# Patient Record
Sex: Female | Born: 1970 | Race: Black or African American | Hispanic: No | Smoking: Current some day smoker
Health system: Southern US, Community
[De-identification: ages and names within clinical notes are randomized; demographics above are authoritative.]

## PROBLEM LIST (undated history)

## (undated) HISTORY — PX: TUBAL LIGATION: SHX77

---

## 2019-05-17 ENCOUNTER — Encounter: Payer: Self-pay | Admitting: *Deleted

## 2019-08-01 ENCOUNTER — Other Ambulatory Visit: Payer: Self-pay

## 2019-08-01 ENCOUNTER — Ambulatory Visit (INDEPENDENT_AMBULATORY_CARE_PROVIDER_SITE_OTHER): Payer: BC Managed Care – PPO | Admitting: *Deleted

## 2019-08-01 DIAGNOSIS — Z1211 Encounter for screening for malignant neoplasm of colon: Secondary | ICD-10-CM

## 2019-08-01 MED ORDER — PEG 3350-KCL-NA BICARB-NACL 420 G PO SOLR: 4000.0000 mL | Freq: Once | ORAL | 0 refills | Status: AC

## 2019-08-01 NOTE — Progress Notes (Signed)
Gastroenterology Pre-Procedure Review  Request Date: 08/01/2019 Requesting Physician: Thornton Papas, NP @ [facility] [place], No previous TCS  PATIENT REVIEW QUESTIONS: The patient responded to the following health history questions as indicated:    1. Diabetes Melitis: No 2. Joint replacements in the past 12 months: No 3. Major health problems in the past 3 months: No 4. Has an artificial valve or MVP: No 5. Has a defibrillator: No 6. Has been advised in past to take antibiotics in advance of a procedure like teeth cleaning: No 7. Family history of colon cancer: No  8. Alcohol Use: Yes, 1 glass of wine daily 9. History of sleep apnea:  No 10. History of coronary artery or other vascular stents placed within the last 12 months: No 11. History of any prior anesthesia complications: No    MEDICATIONS & ALLERGIES:    Patient reports the following regarding taking any blood thinners:   Plavix? No Aspirin? No Coumadin? No Brilinta? No Xarelto? No Eliquis? No Pradaxa? No Savaysa? No Effient? No  Patient confirms/reports the following medications:  Current Outpatient Medications  Medication Sig Dispense Refill  . Multiple Vitamins-Minerals (ALIVE ONCE DAILY WOMENS PO) Take by mouth daily.    . phentermine 37.5 MG capsule Take 37.5 mg by mouth every other day.    Marland Kitchen UNABLE TO FIND daily. Arena Immune Plus adult gummie     No current facility-administered medications for this visit.     Patient confirms/reports the following allergies:  No Known Allergies  No orders of the defined types were placed in this encounter.   AUTHORIZATION INFORMATION Primary Insurance: [place],  [place] #: [id number],  Group #: [id number] Pre-Cert / Josem Kaufmann required: No, file to local BCBS  SCHEDULE INFORMATION: Procedure has been scheduled as follows:  Date: 10/04/2019, Time: 11:00 Location: [facility] with Dr. Oneida Alar  This Gastroenterology Pre-Precedure Review Form is being routed to the following  provider(s): Neil Crouch, PA-C

## 2019-08-01 NOTE — Patient Instructions (Signed)
Anna Sosa   Jun 26, 1971 MRN: [id number]    Procedure Date: 10/04/2019 Time to register: 10:00 am Place to register: [facility] Procedure Time: 11:00 am Scheduled provider: Dr. Oneida Alar  PREPARATION FOR COLONOSCOPY WITH TRI-LYTE SPLIT PREP  Please notify us immediately if you are diabetic, take iron supplements, or if you are on Coumadin or any other blood thinners.   You will need to purchase 1 fleet enema and 1 box of Bisacodyl '5mg'$  tablets.   1 DAY BEFORE PROCEDURE:  DATE: 10/03/2019  DAY: Tuesday Continue clear liquids the entire day - NO SOLID FOOD.    At 2:00 pm:  Take 2 Bisacodyl tablets.   At 4:00pm:  Start drinking your solution. Make sure you mix well per instructions on the bottle. Try to drink 1 (one) 8 ounce glass every 10-15 minutes until you have consumed HALF the jug. You should complete by 6:00pm.You must keep the left over solution refrigerated until completed next day.  Continue clear liquids. You must drink plenty of clear liquids to prevent dehyration and kidney failure.     DAY OF PROCEDURE:   DATE: 10/04/2019  DAY: Wednesday If you take medications for your heart, blood pressure or breathing, you may take these medications.    Five hours before your procedure time @ 6:00 am:  Finish remaining amout of bowel prep, drinking 1 (one) 8 ounce glass every 10-15 minutes until complete. You have two hours to consume remaining prep.   Three hours before your procedure time @ 8:00 am:  Nothing by mouth.   At least one hour before going to the hospital:  Give yourself one Fleet enema. You may take your morning medications with sip of water unless we have instructed otherwise.      Please see below for Dietary Information.  CLEAR LIQUIDS INCLUDE:  Water Jello (NOT red in color)   Ice Popsicles (NOT red in color)   Tea (sugar ok, no milk/cream) Powdered fruit flavored drinks  Coffee (sugar ok, no milk/cream) Gatorade/ Lemonade/ Kool-Aid  (NOT red in  color)   Juice: apple, white grape, white cranberry Soft drinks  Clear bullion, consomme, broth (fat free beef/chicken/vegetable)  Carbonated beverages (any kind)  Strained chicken noodle soup Hard Candy   Remember: Clear liquids are liquids that will allow you to see your fingers on the other side of a clear glass. Be sure liquids are NOT red in color, and not cloudy, but CLEAR.  DO NOT EAT OR DRINK ANY OF THE FOLLOWING:  Dairy products of any kind   Cranberry juice Tomato juice / V8 juice   Grapefruit juice Orange juice     Red grape juice  Do not eat any solid foods, including such foods as: cereal, oatmeal, yogurt, fruits, vegetables, creamed soups, eggs, bread, crackers, pureed foods in a blender, etc.   HELPFUL HINTS FOR DRINKING PREP SOLUTION:   Make sure prep is extremely cold. Mix and refrigerate the the morning of the prep. You may also put in the freezer.   You may try mixing some Crystal Light or Country Time Lemonade if you prefer. Mix in small amounts; add more if necessary.  Try drinking through a straw  Rinse mouth with water or a mouthwash between glasses, to remove after-taste.  Try sipping on a cold beverage /ice/ popsicles between glasses of prep.  Place a piece of sugar-free hard candy in mouth between glasses.  If you become nauseated, try consuming smaller amounts, or stretch out the time  between glasses. Stop for 30-60 minutes, then slowly start back drinking.        OTHER INSTRUCTIONS  You will need a responsible adult at least 48 years of age to accompany you and drive you home. This person must remain in the waiting room during your procedure. The hospital will cancel your procedure if you do not have a responsible adult with you.   1. Wear loose fitting clothing that is easily removed. 2. Leave jewelry and other valuables at home.  3. Remove all body piercing jewelry and leave at home. 4. Total time from sign-in until discharge is approximately  2-3 hours. 5. You should go home directly after your procedure and rest. You can resume normal activities the day after your procedure. 6. The day of your procedure you should not:  Drive  Make legal decisions  Operate machinery  Drink alcohol  Return to work   You may call the office (Dept: [phone]) before 5:00pm, or page the doctor on call [phone]) after 5:00pm, for further instructions, if necessary.   Insurance Information YOU WILL NEED TO CHECK WITH YOUR INSURANCE COMPANY FOR THE BENEFITS OF COVERAGE YOU HAVE FOR THIS PROCEDURE.  UNFORTUNATELY, NOT ALL INSURANCE COMPANIES HAVE BENEFITS TO COVER ALL OR PART OF THESE TYPES OF PROCEDURES.  IT IS YOUR RESPONSIBILITY TO CHECK YOUR BENEFITS, HOWEVER, WE WILL BE GLAD TO ASSIST YOU WITH ANY CODES YOUR INSURANCE COMPANY MAY NEED.    PLEASE NOTE THAT MOST INSURANCE COMPANIES WILL NOT COVER A SCREENING COLONOSCOPY FOR PEOPLE UNDER THE AGE OF 50  IF YOU HAVE BCBS INSURANCE, YOU MAY HAVE BENEFITS FOR A SCREENING COLONOSCOPY BUT IF POLYPS ARE FOUND THE DIAGNOSIS WILL CHANGE AND THEN YOU MAY HAVE A DEDUCTIBLE THAT WILL NEED TO BE MET. SO PLEASE MAKE SURE YOU CHECK YOUR BENEFITS FOR A SCREENING COLONOSCOPY AS WELL AS A DIAGNOSTIC COLONOSCOPY.

## 2019-08-04 NOTE — Progress Notes (Signed)
Ok to schedule.

## 2019-08-08 NOTE — Addendum Note (Signed)
Addended by: Metro Kung on: 08/08/2019 09:50 AM   Modules accepted: Orders, SmartSet

## 2019-10-03 ENCOUNTER — Other Ambulatory Visit (HOSPITAL_COMMUNITY)
Admission: RE | Admit: 2019-10-03 | Discharge: 2019-10-03 | Disposition: A | Discharge status: Home / Self Care | Payer: BC Managed Care – PPO | Source: Ambulatory Visit | Attending: Gastroenterology | Admitting: Gastroenterology

## 2019-10-03 ENCOUNTER — Other Ambulatory Visit: Payer: Self-pay

## 2019-10-03 DIAGNOSIS — Z01812 Encounter for preprocedural laboratory examination: Secondary | ICD-10-CM | POA: Insufficient documentation

## 2019-10-03 DIAGNOSIS — Z20828 Contact with and (suspected) exposure to other viral communicable diseases: Secondary | ICD-10-CM | POA: Insufficient documentation

## 2019-10-03 LAB — SARS CORONAVIRUS 2 (TAT 6-24 HRS): SARS Coronavirus 2: NEGATIVE

## 2019-10-04 ENCOUNTER — Ambulatory Visit (HOSPITAL_COMMUNITY)
Admission: RE | Admit: 2019-10-04 | Discharge: 2019-10-04 | Disposition: A | Discharge status: Home / Self Care | Payer: BC Managed Care – PPO | Attending: Gastroenterology | Admitting: Gastroenterology

## 2019-10-04 ENCOUNTER — Encounter (HOSPITAL_COMMUNITY)
Admission: RE | Disposition: A | Discharge status: Home / Self Care | Payer: Self-pay | Source: Home / Self Care | Attending: Gastroenterology

## 2019-10-04 ENCOUNTER — Other Ambulatory Visit: Payer: Self-pay

## 2019-10-04 ENCOUNTER — Encounter (HOSPITAL_COMMUNITY): Payer: Self-pay | Admitting: *Deleted

## 2019-10-04 DIAGNOSIS — K635 Polyp of colon: Secondary | ICD-10-CM

## 2019-10-04 DIAGNOSIS — Q438 Other specified congenital malformations of intestine: Secondary | ICD-10-CM | POA: Insufficient documentation

## 2019-10-04 DIAGNOSIS — D123 Benign neoplasm of transverse colon: Secondary | ICD-10-CM | POA: Insufficient documentation

## 2019-10-04 DIAGNOSIS — K648 Other hemorrhoids: Secondary | ICD-10-CM | POA: Diagnosis not present

## 2019-10-04 DIAGNOSIS — K644 Residual hemorrhoidal skin tags: Secondary | ICD-10-CM | POA: Diagnosis not present

## 2019-10-04 DIAGNOSIS — K5730 Diverticulosis of large intestine without perforation or abscess without bleeding: Secondary | ICD-10-CM | POA: Insufficient documentation

## 2019-10-04 DIAGNOSIS — Z1211 Encounter for screening for malignant neoplasm of colon: Secondary | ICD-10-CM | POA: Diagnosis not present

## 2019-10-04 HISTORY — PX: POLYPECTOMY: SHX5525

## 2019-10-04 HISTORY — PX: COLONOSCOPY: SHX5424

## 2019-10-04 SURGERY — COLONOSCOPY
Anesthesia: Moderate Sedation

## 2019-10-04 MED ORDER — MIDAZOLAM HCL 5 MG/5ML IJ SOLN
INTRAMUSCULAR | Status: DC | PRN
  Administered 2019-10-04: 2 mg via INTRAVENOUS
  Administered 2019-10-04 (×2): 1 mg via INTRAVENOUS

## 2019-10-04 MED ORDER — STERILE WATER FOR IRRIGATION IR SOLN
Status: DC | PRN
  Administered 2019-10-04: 2.5 mL

## 2019-10-04 MED ORDER — MEPERIDINE HCL 100 MG/ML IJ SOLN
INTRAMUSCULAR | Status: DC | PRN
  Administered 2019-10-04 (×2): 25 mg

## 2019-10-04 MED ORDER — MIDAZOLAM HCL 5 MG/5ML IJ SOLN
INTRAMUSCULAR | Status: AC
  Filled 2019-10-04: qty 10

## 2019-10-04 MED ORDER — SODIUM CHLORIDE 0.9 % IV SOLN
INTRAVENOUS | Status: DC
  Administered 2019-10-04: 11:00:00 1000 mL via INTRAVENOUS

## 2019-10-04 MED ORDER — MEPERIDINE HCL 100 MG/ML IJ SOLN
INTRAMUSCULAR | Status: AC
  Filled 2019-10-04: qty 2

## 2019-10-04 NOTE — Op Note (Signed)
[facility] Patient Name: Anna Sosa Procedure Date: 10/04/2019 11:15 AM MRN: [id number] Date of Birth: Jul 03, 1971 Attending MD: Barney Drain MD, MD CSN: [id number] Age: 48 Admit Type: Outpatient Procedure:                Colonoscopy WITH COLD SNARE POLYPECTOMY Indications:              Screening for colorectal malignant neoplasm Providers:                Barney Drain MD, MD, Gwynneth Albright RN, RN,                            Nelma Rothman, Technician Referring MD:             Frances Maywood, FNP Medicines:                Meperidine 50 mg IV, Midazolam 4 mg IV Complications:            No immediate complications. Estimated Blood Loss:     Estimated blood loss was minimal. Procedure:                Pre-Anesthesia Assessment:                           - Prior to the procedure, a History and Physical                            was performed, and patient medications and                            allergies were reviewed. The patient's tolerance of                            previous anesthesia was also reviewed. The risks                            and benefits of the procedure and the sedation                            options and risks were discussed with the patient.                            All questions were answered, and informed consent                            was obtained. Prior Anticoagulants: The patient has                            taken no previous anticoagulant or antiplatelet                            agents except for NSAID medication. ASA Grade                            Assessment: II - A patient with mild systemic  disease. After reviewing the risks and benefits,                            the patient was deemed in satisfactory condition to                            undergo the procedure. After obtaining informed                            consent, the colonoscope was passed under direct                            vision.  Throughout the procedure, the patient's                            blood pressure, pulse, and oxygen saturations were                            monitored continuously. The PCF-[id number]                            scope was introduced through the anus and advanced                            to the the cecum, identified by appendiceal orifice                            and ileocecal valve. The colonoscopy was somewhat                            difficult due to a tortuous colon and the patient's                            agitation. Successful completion of the procedure                            was aided by increasing the dose of sedation                            medication, straightening and shortening the scope                            to obtain bowel loop reduction and COLOWRAP. The                            patient tolerated the procedure fairly well. The                            quality of the bowel preparation was good. The                            ileocecal valve, appendiceal orifice, and rectum  were photographed. Scope In: 11:51:32 AM Scope Out: 12:11:42 PM Scope Withdrawal Time: 0 hours 14 minutes 26 seconds  Total Procedure Duration: 0 hours 20 minutes 10 seconds  Findings:      A 4 mm polyp was found in the hepatic flexure. The polyp was sessile.       The polyp was removed with a cold snare. Resection and retrieval were       complete.      Multiple small and large-mouthed diverticula were found in the       recto-sigmoid colon and sigmoid colon.      External and internal hemorrhoids were found.      The recto-sigmoid colon, sigmoid colon, descending colon and splenic       flexure were moderately tortuous. Impression:               - One 4 mm polyp at the hepatic flexure, removed                            with a cold snare. Resected and retrieved.                           - Diverticulosis in the recto-sigmoid colon and in                             the sigmoid colon.                           - External and internal hemorrhoids.                           - Tortuous colon. Moderate Sedation:      Moderate (conscious) sedation was administered by the endoscopy nurse       and supervised by the endoscopist. The following parameters were       monitored: oxygen saturation, heart rate, blood pressure, and response       to care. Total physician intraservice time was 27 minutes. Recommendation:           - Patient has a contact number available for                            emergencies. The signs and symptoms of potential                            delayed complications were discussed with the                            patient. Return to normal activities tomorrow.                            Written discharge instructions were provided to the                            patient.                           - High fiber diet. LOSE WEIGHT TO A BMI < 30  FORLLOWING "THE 10 DAY DETOX DIET".                           - Continue present medications.                           - Await pathology results.                           - Repeat colonoscopy in 5-10 years for surveillance. Procedure Code(s):        --- Professional ---                           [id number], Colonoscopy, flexible; with removal of                            tumor(s), polyp(s), or other lesion(s) by snare                            technique                           99153, Moderate sedation; each additional 15                            minutes intraservice time                           G0500, Moderate sedation services provided by the                            same physician or other qualified health care                            professional performing a gastrointestinal                            endoscopic service that sedation supports,                            requiring the presence of an independent trained                             observer to assist in the monitoring of the                            patient's level of consciousness and physiological                            status; initial 15 minutes of intra-service time;                            patient age 24 years or older (additional time may                            be reported  with [phone], as appropriate) Diagnosis Code(s):        --- Professional ---                           Z12.11, Encounter for screening for malignant                            neoplasm of colon                           K63.5, Polyp of colon                           K64.8, Other hemorrhoids                           K57.30, Diverticulosis of large intestine without                            perforation or abscess without bleeding                           Q43.8, Other specified congenital malformations of                            intestine CPT copyright 2019 American Medical Association. All rights reserved. The codes documented in this report are preliminary and upon coder review may  be revised to meet current compliance requirements. Barney Drain, MD Barney Drain MD, MD 10/04/2019 12:29:19 PM This report has been signed electronically. Number of Addenda: 0

## 2019-10-04 NOTE — Discharge Instructions (Signed)
You have small internal hemorrhoids and diverticulosis IN YOUR LEFT COLON. YOU HAD ONE SMALL POLYP REMOVED.    EAT TO LIVE AND THINK OF FOOD AS MEDICINE. 75% OF YOUR PLATE SHOULD BE FRUITS/VEGGIES.  To have more energy, and to lose weight:      1. CONTINUE YOUR WEIGHT LOSS EFFORTS. I RECOMMEND YOU READ AND FOLLOW RECOMMENDATIONS BY DR. MARK HYMAN, "10-DAY DETOX DIET".    2. If you must eat bread, EAT EZEKIEL BREAD. IT IS IN THE FROZEN SECTION OF THE GROCERY STORE.    3. DRINK WATER WITH FRUIT OR CUCUMBER ADDED. YOUR URINE SHOULD BE LIGHT YELLOW. AVOID SODA, GATORADE, ENERGY DRINKS, OR DIET SODA.     4. AVOID HIGH FRUCTOSE CORN SYRUP AND CAFFEINE.     5. DO NOT chew SUGAR FREE GUM OR USE ARTIFICIAL SWEETENERS. IF NEEDED USE STEVIA AS A SWEETENER.    6. DO NOT EAT ENRICHED WHEAT FLOUR, PASTA, RICE, OR CEREAL.    7. ONLY EAT WILD CAUGHT SEAFOOD, GRASS FED BEEF OR CHICKEN, PORK FROM PASTURE RAISE PIGS, OR EGGS FROM PASTURE RAISED CHICKENS.    8. PRACTICE CHAIR YOGA FOR 15-30 MINS 3 OR 4 TIMES A WEEK AND PROGRESS TO HATHA YOGA OVER NEXT 6 MOS.    9. START TAKING A MULTIVITAMIN, VITAMIN B12, AND VITAMIN D3 2000 IU DAILY.   ADDITIONAL SUPPLEMENTS TO assist with weight loss, DECREASE CRAVING AND SUPPRESS YOUR APPETITE:    1. CINNAMON 500 MG EVERY AM PRIOR TO FIRST MEAL.   **STABILIZES BLOOD GLUCOSE/REDUCES CRAVINGS**    2. CHROMIUM 400-500 MG WITH MEALS TWICE DAILY.    **FAT BURNER**    3. GREEN TEA EXTRACT ONE DAILY.   **FAT BURNER/SUPPRESSES YOUR APPETITE**    4. ALPHA LIPOIC ACID TWICE DAILY.   **NATURAL ANTI-INFLAMMATORY SUPPLEMENT THAT IS AN ALTERNATIVE TO IBUPROFEN OR NAPROXEN** .  USE PREPARATION H FOUR TIMES  A DAY IF NEEDED TO RELIEVE RECTAL PAIN/PRESSURE/BLEEDING.  YOUR BIOPSY RESULTS WILL BE BACK IN 5 BUSINESS DAYS.  Next colonoscopy in 5-10 years.  Colonoscopy Care After Read the instructions outlined below and refer to this sheet in the next week. These discharge  instructions provide you with general information on caring for yourself after you leave the hospital. While your treatment has been planned according to the most current medical practices available, unavoidable complications occasionally occur. If you have any problems or questions after discharge, call DR. Trinisha Paget, [phone].  ACTIVITY  You may resume your regular activity, but move at a slower pace for the next 24 hours.   Take frequent rest periods for the next 24 hours.   Walking will help get rid of the air and reduce the bloated feeling in your belly (abdomen).   No driving for 24 hours (because of the medicine (anesthesia) used during the test).   You may shower.   Do not sign any important legal documents or operate any machinery for 24 hours (because of the anesthesia used during the test).    NUTRITION  Drink plenty of fluids.   You may resume your normal diet as instructed by your doctor.   Begin with a light meal and progress to your normal diet. Heavy or fried foods are harder to digest and may make you feel sick to your stomach (nauseated).   Avoid alcoholic beverages for 24 hours or as instructed.    MEDICATIONS  You may resume your normal medications.   WHAT YOU CAN EXPECT TODAY  Some feelings of bloating  in the abdomen.   Passage of more gas than usual.   Spotting of blood in your stool or on the toilet paper  .  IF YOU HAD POLYPS REMOVED DURING THE COLONOSCOPY:  Eat a soft diet IF YOU HAVE NAUSEA, BLOATING, ABDOMINAL PAIN, OR VOMITING.    FINDING OUT THE RESULTS OF YOUR TEST Not all test results are available during your visit. DR. Oneida Alar WILL CALL YOU WITHIN 14 DAYS OF YOUR PROCEDUE WITH YOUR RESULTS. Do not assume everything is normal if you have not heard from DR. Katsumi Wisler, CALL HER OFFICE AT [phone].  SEEK IMMEDIATE MEDICAL ATTENTION AND CALL THE OFFICE: [phone] IF:  You have more than a spotting of blood in your stool.   Your  belly is swollen (abdominal distention).   You are nauseated or vomiting.   You have a temperature over 101F.   You have abdominal pain or discomfort that is severe or gets worse throughout the day.  High-Fiber Diet A high-fiber diet changes your normal diet to include more whole grains, legumes, fruits, and vegetables. Changes in the diet involve replacing refined carbohydrates with unrefined foods. The calorie level of the diet is essentially unchanged. The Dietary Reference Intake (recommended amount) for adult males is 38 grams per day. For adult females, it is 25 grams per day. Pregnant and lactating women should consume 28 grams of fiber per day. Fiber is the intact part of a plant that is not broken down during digestion. Functional fiber is fiber that has been isolated from the plant to provide a beneficial effect in the body.  PURPOSE  Increase stool bulk.   Ease and regulate bowel movements.   Lower cholesterol.   REDUCE RISK OF COLON CANCER  INDICATIONS THAT YOU NEED MORE FIBER  Constipation and hemorrhoids.   Uncomplicated diverticulosis (intestine condition) and irritable bowel syndrome.   Weight management.   As a protective measure against hardening of the arteries (atherosclerosis), diabetes, and cancer.   GUIDELINES FOR INCREASING FIBER IN THE DIET  Start adding fiber to the diet slowly. A gradual increase of about 5 more grams (2 servings of most fruits or vegetables) per day is best. Too rapid an increase in fiber may result in constipation, flatulence, and bloating.   Drink enough water and fluids to keep your urine clear or pale yellow. Water, juice, or caffeine-free drinks are recommended. Not drinking enough fluid may cause constipation.   Eat a variety of high-fiber foods rather than one type of fiber.   Try to increase your intake of fiber through using high-fiber foods rather than fiber pills or supplements that contain small amounts of fiber.   The  goal is to change the types of food eaten. Do not supplement your present diet with high-fiber foods, but replace foods in your present diet.    Polyps, Colon  A polyp is extra tissue that grows inside your body. Colon polyps grow in the large intestine. The large intestine, also called the colon, is part of your digestive system. It is a long, hollow tube at the end of your digestive tract where your body makes and stores stool. Most polyps are not dangerous. They are benign. This means they are not cancerous. But over time, some types of polyps can turn into cancer. Polyps that are smaller than a pea are usually not harmful. But larger polyps could someday become or may already be cancerous. To be safe, doctors remove all polyps and test them.   PREVENTION There  is not one sure way to prevent polyps. You might be able to lower your risk of getting them if you:  Eat more fruits and vegetables and less fatty food.   Do not smoke.   Avoid alcohol.   Exercise every day.   Lose weight if you are overweight.   Eating more calcium and folate can also lower your risk of getting polyps. Some foods that are rich in calcium are milk, cheese, and broccoli. Some foods that are rich in folate are chickpeas, kidney beans, and spinach.    Diverticulosis Diverticulosis is a common condition that develops when small pouches (diverticula) form in the wall of the colon. The risk of diverticulosis increases with age. It happens more often in people who eat a low-fiber diet. Most individuals with diverticulosis have no symptoms. Those individuals with symptoms usually experience belly (abdominal) pain, constipation, or loose stools (diarrhea).  HOME CARE INSTRUCTIONS  Increase the amount of fiber in your diet as directed by your caregiver or dietician. This may reduce symptoms of diverticulosis.   Drink at least 6 to 8 glasses of water each day to prevent constipation.   Try not to strain when you have a  bowel movement.   Avoiding nuts and seeds to prevent complications is NOT NECESSARY.   FOODS HAVING HIGH FIBER CONTENT INCLUDE:  Fruits. Apple, peach, pear, tangerine, raisins, prunes.   Vegetables. Brussels sprouts, asparagus, broccoli, cabbage, carrot, cauliflower, romaine lettuce, spinach, summer squash, tomato, winter squash, zucchini.   Starchy Vegetables. Baked beans, kidney beans, lima beans, split peas, lentils, potatoes (with skin).   SEEK IMMEDIATE MEDICAL CARE IF:  You develop increasing pain or severe bloating.   You have an oral temperature above 101F.   You develop vomiting or bowel movements that are bloody or black.

## 2019-10-04 NOTE — H&P (Signed)
Primary Care Physician:  Frances Maywood, FNP Primary Gastroenterologist:  Dr. Oneida Alar  Pre-Procedure History & Physical: HPI:  Anna Sosa is a 48 y.o. female here for [place]. Ate FULL MEAL YESTERDAY.  History reviewed. No pertinent past medical history.  Past Surgical History:  Procedure Laterality Date  . TUBAL LIGATION      Prior to Admission medications   Medication Sig Start Date End Date Taking? Authorizing Provider  acetaminophen (TYLENOL) 500 MG tablet Take 1,000 mg by mouth every 6 (six) hours as needed for moderate pain or headache.   Yes [provider]  Ca Carbonate-Mag Hydroxide (ROLAIDS PO) Take 2 tablets by mouth daily as needed (acid reflux).   Yes [provider]  diphenhydrAMINE (BENADRYL) 25 MG tablet Take 25 mg by mouth at bedtime as needed for allergies or sleep.   Yes [provider]  Ferrous Sulfate (IRON PO) Take 9 mg by mouth daily.   Yes [provider]  ibuprofen (ADVIL) 200 MG tablet Take 600 mg by mouth every 6 (six) hours as needed for moderate pain or cramping.   Yes [provider]  Misc Natural Products (IMMUNE FORMULA PO) Take 1 tablet by mouth daily. Immune Plus   Yes [provider]  phentermine 37.5 MG capsule Take 37.5 mg by mouth every other day.   Yes [provider]  vitamin B-12 (CYANOCOBALAMIN) 1000 MCG tablet Take 1,000 mcg by mouth daily.   Yes [provider]    Allergies as of 08/08/2019  . (No Known Allergies)    Family History  Problem Relation Age of Onset  . Lymphoma Mother   . Prostate cancer Father     Social History   Socioeconomic History  . Marital status: Divorced    Spouse name: Not on file  . Number of children: Not on file  . Years of education: Not on file  . Highest education level: Not on file  Occupational History  . Not on file  Social Needs  . Financial resource strain: Not on file  . Food insecurity    Worry: Not on  file    Inability: Not on file  . Transportation needs    Medical: Not on file    Non-medical: Not on file  Tobacco Use  . Smoking status: Current Some Day Smoker  . Smokeless tobacco: Former Systems developer  . Tobacco comment: Smokes 1/2 of a small cigar every other day  Substance and Sexual Activity  . Alcohol use: Yes    Comment: 1 glass of wine daily  . Drug use: Never  . Sexual activity: Not on file  Lifestyle  . Physical activity    Days per week: Not on file    Minutes per session: Not on file  . Stress: Not on file  Relationships  . Social Herbalist on phone: Not on file    Gets together: Not on file    Attends religious service: Not on file    Active member of club or organization: Not on file    Attends meetings of clubs or organizations: Not on file    Relationship status: Not on file  . Intimate partner violence    Fear of current or ex partner: Not on file    Emotionally abused: Not on file    Physically abused: Not on file    Forced sexual activity: Not on file  Other Topics Concern  . Not on file  Social History  Narrative  . Not on file    Review of Systems: See HPI, otherwise negative ROS   Physical Exam: BP (!) 147/94   Pulse 80   Temp 98.6 F (37 C) (Oral)   Resp 14   Ht 5' (1.524 m)   Wt 90.7 kg   LMP 09/29/2019   SpO2 91%   BMI 39.06 kg/m  General:   Alert,  pleasant and cooperative in NAD Head:  Normocephalic and atraumatic. Neck:  Supple; Lungs:  Clear throughout to auscultation.    Heart:  Regular rate and rhythm. Abdomen:  Soft, nontender and nondistended. Normal bowel sounds, without guarding, and without rebound.   Neurologic:  Alert and  oriented x4;  grossly normal neurologically.  Impression/Plan:    SCREENING  Plan:  1. TCS TODAY DISCUSSED PROCEDURE, BENEFITS, & RISKS: < 1% chance of medication reaction, bleeding, perforation, ASPIRATION, or rupture of spleen/liver requiring surgery to fix it and missed polyps < 1 cm  10-20% of the time.

## 2019-10-05 LAB — SURGICAL PATHOLOGY

## 2019-10-06 ENCOUNTER — Encounter (HOSPITAL_COMMUNITY): Payer: Self-pay | Admitting: Gastroenterology

## 2019-10-07 ENCOUNTER — Telehealth: Payer: Self-pay | Admitting: Gastroenterology

## 2019-10-07 NOTE — Telephone Encounter (Signed)
Please call pt. She had ONE simple adenoma removed.  DRINK WATER TO KEEP YOUR URINE LIGHT YELLOW. FOLLOW A HIGH FIBER DIET.  YOU SHOULD HAVE YOUR NEXT COLONOSCOPY AS SOON AS 2025 OR AS LATE AS 2027.

## 2019-10-09 NOTE — Telephone Encounter (Signed)
Reminder in epic °

## 2019-10-09 NOTE — Telephone Encounter (Signed)
Letter mailed with info.

## 2019-10-18 ENCOUNTER — Emergency Department (HOSPITAL_COMMUNITY): Payer: BC Managed Care – PPO

## 2019-10-18 ENCOUNTER — Other Ambulatory Visit: Payer: Self-pay

## 2019-10-18 ENCOUNTER — Encounter (HOSPITAL_COMMUNITY): Payer: Self-pay | Admitting: Emergency Medicine

## 2019-10-18 ENCOUNTER — Emergency Department (HOSPITAL_COMMUNITY)
Admission: EM | Admit: 2019-10-18 | Discharge: 2019-10-18 | Disposition: A | Discharge status: Home / Self Care | Payer: BC Managed Care – PPO | Attending: Emergency Medicine | Admitting: Emergency Medicine

## 2019-10-18 DIAGNOSIS — Y999 Unspecified external cause status: Secondary | ICD-10-CM | POA: Insufficient documentation

## 2019-10-18 DIAGNOSIS — Y92410 Unspecified street and highway as the place of occurrence of the external cause: Secondary | ICD-10-CM | POA: Insufficient documentation

## 2019-10-18 DIAGNOSIS — Y93I9 Activity, other involving external motion: Secondary | ICD-10-CM | POA: Diagnosis not present

## 2019-10-18 DIAGNOSIS — S0181XA Laceration without foreign body of other part of head, initial encounter: Secondary | ICD-10-CM

## 2019-10-18 DIAGNOSIS — F17210 Nicotine dependence, cigarettes, uncomplicated: Secondary | ICD-10-CM | POA: Insufficient documentation

## 2019-10-18 DIAGNOSIS — S0990XA Unspecified injury of head, initial encounter: Secondary | ICD-10-CM

## 2019-10-18 LAB — POC URINE PREG, ED: Preg Test, Ur: NEGATIVE

## 2019-10-18 MED ORDER — LIDOCAINE HCL (PF) 1 % IJ SOLN
INTRAMUSCULAR | Status: AC
  Administered 2019-10-18: 5 mL
  Filled 2019-10-18: qty 5

## 2019-10-18 MED ORDER — LIDOCAINE-EPINEPHRINE-TETRACAINE (LET) TOPICAL GEL: 3.0000 mL | Freq: Once | TOPICAL | Status: DC

## 2019-10-18 MED ORDER — LIDOCAINE-EPINEPHRINE-TETRACAINE (LET) SOLUTION
3.0000 mL | Freq: Once | NASAL | Status: AC
  Administered 2019-10-18: 3 mL via TOPICAL
  Filled 2019-10-18: qty 3

## 2019-10-18 MED ORDER — HYDROCODONE-ACETAMINOPHEN 5-325 MG PO TABS: 1.0000 | ORAL_TABLET | Freq: Four times a day (QID) | ORAL | 0 refills | Status: DC | PRN
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
## 2019-10-19 MED FILL — Hydrocodone-Acetaminophen Tab 5-325 MG: ORAL | Qty: 6 | Status: AC
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
## 2021-07-09 ENCOUNTER — Other Ambulatory Visit: Payer: Self-pay | Admitting: Podiatry

## 2021-07-09 DIAGNOSIS — M79671 Pain in right foot: Secondary | ICD-10-CM

## 2021-07-09 DIAGNOSIS — S99921A Unspecified injury of right foot, initial encounter: Secondary | ICD-10-CM

## 2021-07-21 ENCOUNTER — Other Ambulatory Visit: Payer: Self-pay

## 2021-07-21 ENCOUNTER — Ambulatory Visit
Admission: RE | Admit: 2021-07-21 | Discharge: 2021-07-21 | Disposition: A | Discharge status: Home / Self Care | Payer: BC Managed Care – PPO | Source: Ambulatory Visit | Attending: Podiatry | Admitting: Podiatry

## 2021-07-21 DIAGNOSIS — S99921A Unspecified injury of right foot, initial encounter: Secondary | ICD-10-CM

## 2021-07-21 DIAGNOSIS — M79671 Pain in right foot: Secondary | ICD-10-CM

## 2022-12-03 IMAGING — MR MR FOOT*R* W/O CM
4 of 5 series · 18 of 40 positions shown · non-contrast
Comparison: None.

CLINICAL DATA: First MTP joint pain for 5 months. Injury while
dancing

EXAM:
MRI OF THE RIGHT FOREFOOT WITHOUT CONTRAST
TECHNIQUE: Multiplanar, multisequence MR imaging of the right forefoot was
performed. No intravenous contrast was administered.

[Series 4: T1 · coronal · 3.0mm · 0.22mm/px · 3 of 45 slices shown (1 of 2)]
[im 5/45]
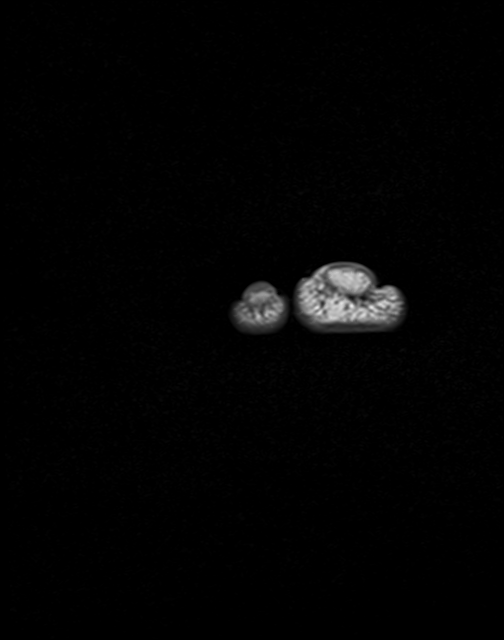
[im 23/45]
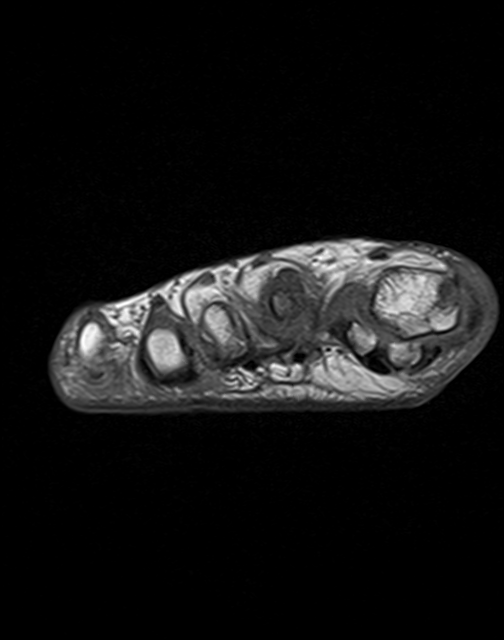
[im 40/45]
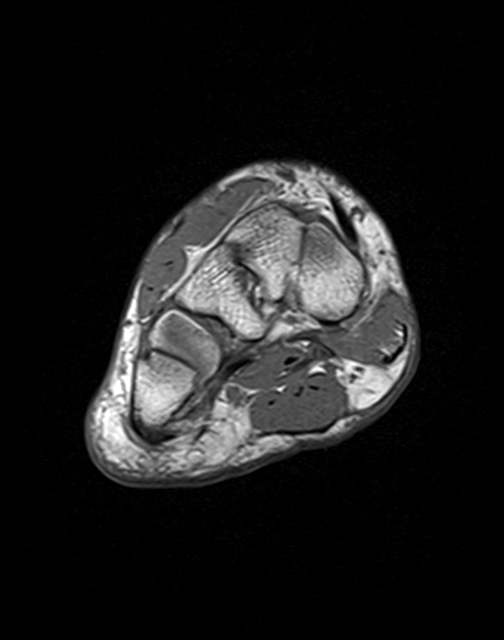

[Series 5: T2 fat-sat · coronal · 3.0mm · 0.22mm/px · 9 of 45 slices shown (1 of 2)]
[im 1/45]
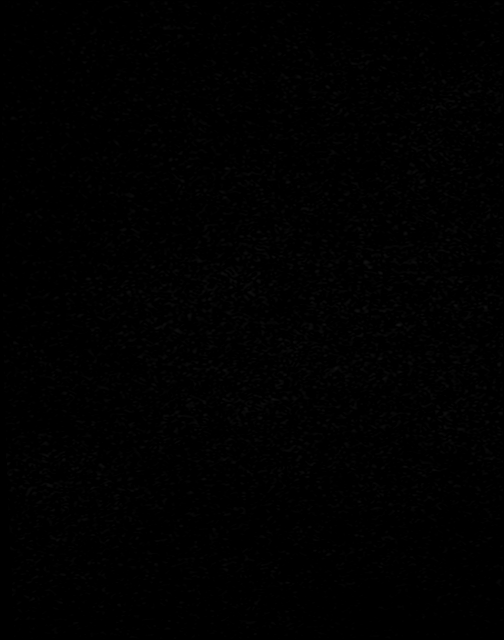
[im 5/45]
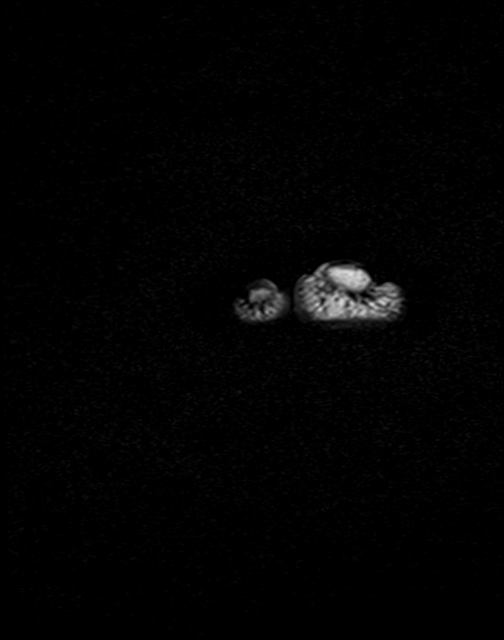
[im 9/45]
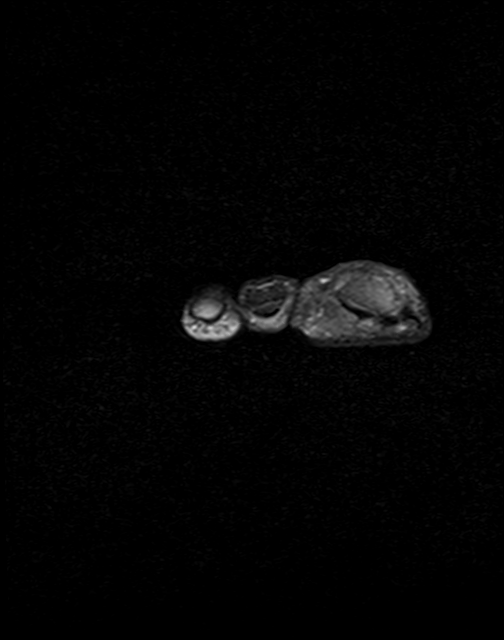
[im 14/45]
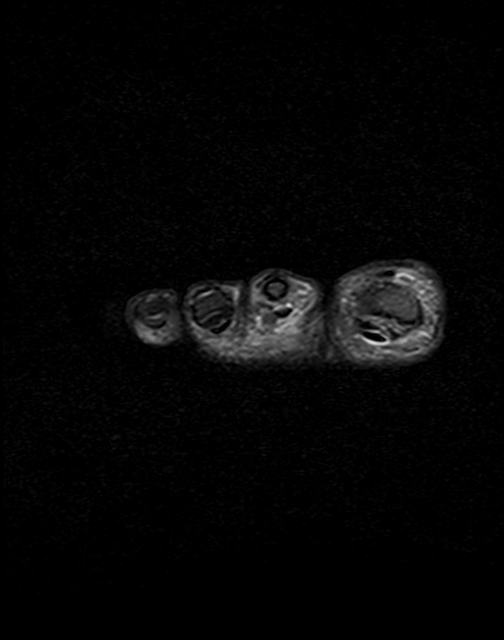
[im 18/45]
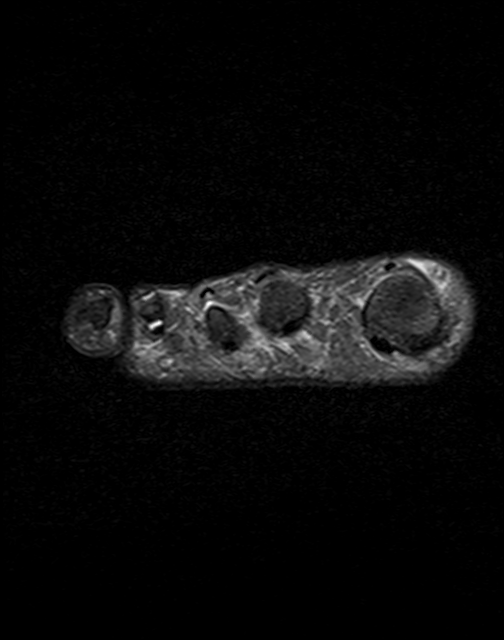
[im 23/45]
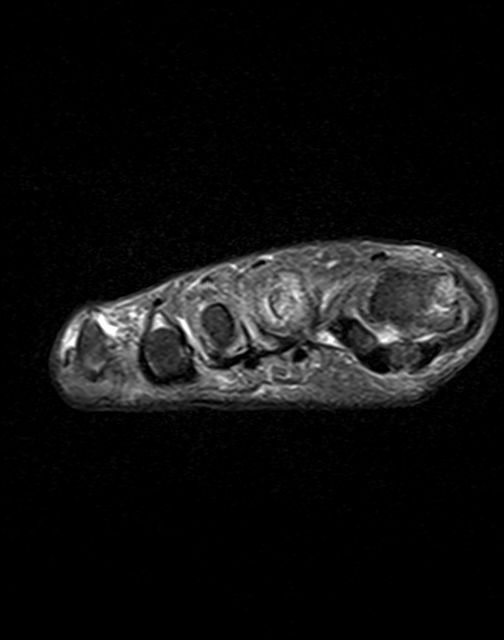
[im 27/45]
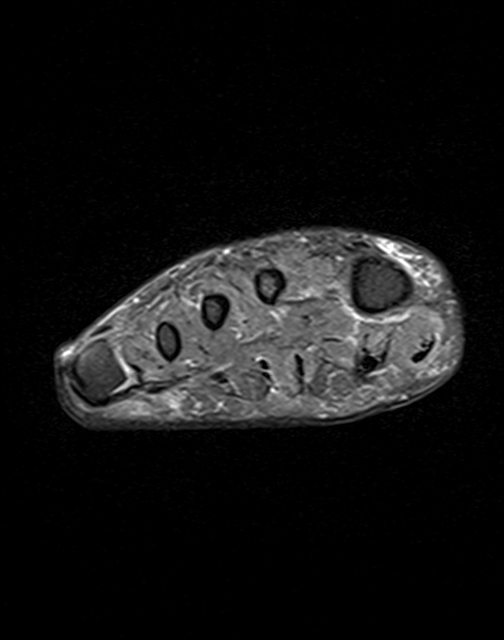
[im 31/45]
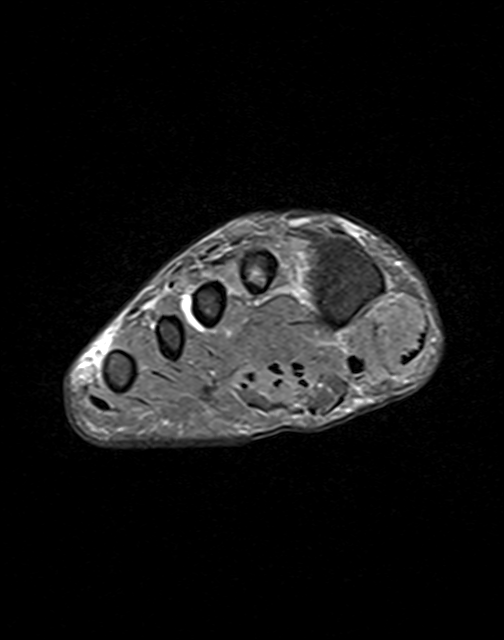
[im 40/45]
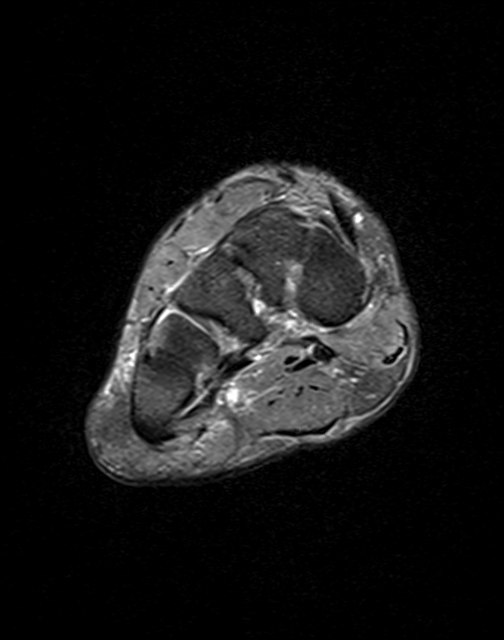

[Series 6: T1 · axial · 3.0mm · 0.35mm/px · z∈[-113,-35]mm · 3 of 22 slices shown (2 of 2)]
[im 1/22]
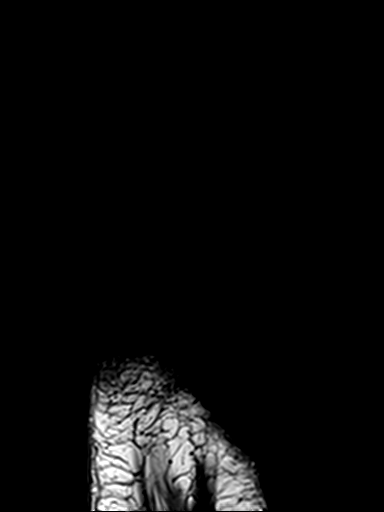
[im 11/22]
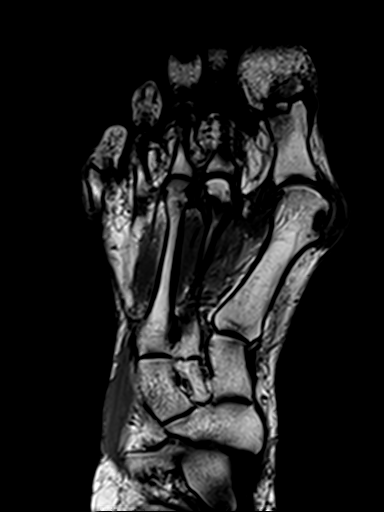
[im 22/22]
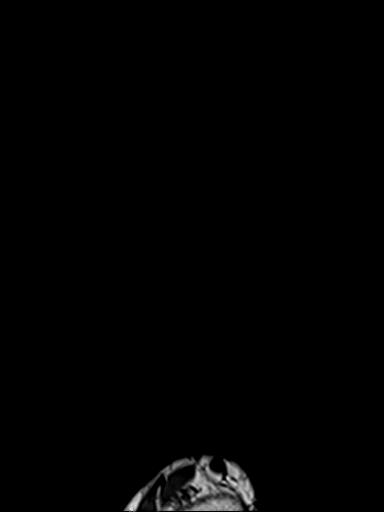

[Series 7: T2 fat-sat · axial · 3.0mm · 0.35mm/px · z∈[-113,-35]mm · 3 of 22 slices shown (2 of 2)]
[im 1/22]
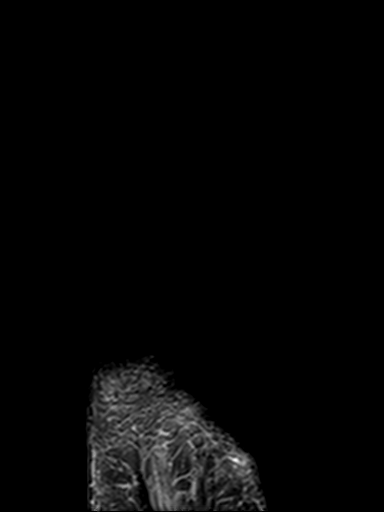
[im 11/22]
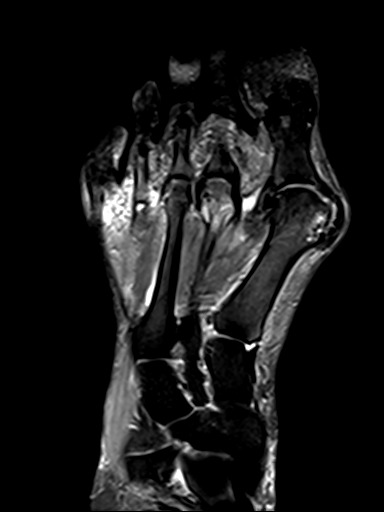
[im 22/22]
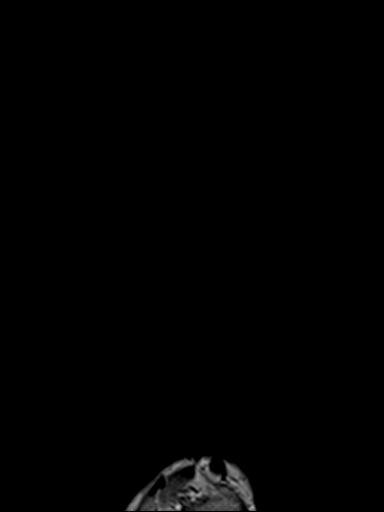

[18 of 40 positions shown; findings below may reference images not displayed]

FINDINGS: Bones/Joint/Cartilage

Acute nondisplaced fracture of the second metatarsal neck with
prominent adjacent bone marrow edema. Mild bone marrow edema
throughout the second metatarsal diaphysis. No additional fractures.
Moderate-severe hallux valgus deformity with moderate osteoarthritis
of the first MTP joint. Lateral subluxation of the hallux sesamoids.
Mild osteoarthritis of the hallux sesamoid complex. Possible erosion
along the medial margin of the first metatarsal head potentially
reflecting sequela of crystalline arthropathy such as gout.
Remaining osseous structures are within normal limits.

Ligaments

Intact Lisfranc ligament. First MTP joint capsular thickening. No
evidence of acute collateral ligament injury.

Muscles and Tendons

Intact flexor and extensor tendons without tear or tenosynovitis.
Preserved muscle bulk and signal intensity.

Soft tissues

Small amount of fluid within the first and third intermetatarsal
spaces. Elsewhere, no soft tissue edema or fluid collection. No
ulcerations.
IMPRESSION: 1. Acute nondisplaced fracture of the second metatarsal neck with
prominent adjacent bone marrow edema and mild bone marrow edema
throughout the second metatarsal diaphysis.
2. Moderate-severe hallux valgus deformity with moderate
osteoarthritis of the first MTP joint.
3. Possible erosion along the medial margin of the first metatarsal
head potentially reflecting sequela of crystalline arthropathy such
as gout.

## 2023-09-03 ENCOUNTER — Ambulatory Visit: Payer: BC Managed Care – PPO | Admitting: Podiatry

## 2023-09-09 ENCOUNTER — Ambulatory Visit: Payer: BC Managed Care – PPO | Admitting: Podiatry

## 2023-09-09 ENCOUNTER — Ambulatory Visit (INDEPENDENT_AMBULATORY_CARE_PROVIDER_SITE_OTHER): Payer: BC Managed Care – PPO

## 2023-09-09 DIAGNOSIS — M21611 Bunion of right foot: Secondary | ICD-10-CM

## 2023-09-09 DIAGNOSIS — M21619 Bunion of unspecified foot: Secondary | ICD-10-CM

## 2023-09-09 NOTE — Progress Notes (Signed)
  Subjective:  Patient ID: Anna Sosa, female    DOB: February 13, 1971,  MRN: [id number]  Chief Complaint  Patient presents with   Bunions    Bunion to right foot. Patient is interested in surgery.      52 y.o. female presents with concern for large bunion to the right foot.  Patient having a lot of pain and not able to wear regular shoes due to the severity of the bunion deformity.  No past medical history on file.  No Known Allergies  ROS: Negative except as per HPI above  Objective:  General: AAO x3, NAD  Dermatological: With inspection and palpation of the right and left lower extremities there are no open sores, no preulcerative lesions, no rash or signs of infection present. Nails are of normal length thickness and coloration.   Vascular:  Dorsalis Pedis artery and Posterior Tibial artery pedal pulses are 2/4 bilateral.  Capillary fill time < 3 sec to all digits.   Neruologic: Grossly intact via light touch bilateral. Protective threshold intact to all sites bilateral.   Musculoskeletal: Moderate to severe hallux abductovalgus deformity is noted on the right foot with significant hypermobility of the first ray greater than +5 mm dorsal plantar excursion at the TMT J.  Pain on palpation of the medial eminence.  Severe valgus deformity of the great toe which is under lapping the second toe  Gait: Unassisted, Nonantalgic.   No images are attached to the encounter.  Radiographs:  Date: 09/09/2023 XR the right foot Weightbearing AP/Lateral/Oblique   Findings: Significant increase in first intermetatarsal angle on the right foot and the increased hallux valgus angle as well.  Abnormal tibial sesamoid position.  Metatarsal parabola is overall intact Assessment:   1. Bunion of right foot      Plan:  Patient was evaluated and treated and all questions answered.  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 -Discussed with patient conservative or surgical treatment  options -Conservatively could proceed with wider toebox shoes gel padding anti-inflammatories however she has tried these and they have failed -Surgically would recommend bunionectomy with Lapidus procedure and possible Akin osteotomy as needed -This is related to the patient's degree of hypermobility and the moderate to severe bunion deformities she has which would not be amenable to minimally invasive procedure with too high risk of recurrence without procedure -Discussed the risk benefits alternatives and possible complications associate with Lapidus bunionectomy.  Patient wishes to proceed informed patient of the postoperative recovery period as well.  Informed surgical consent was obtained at this visit we will begin surgical planning  Return for after OR.          Corinna Gab, DPM [facility] / [facility]

## 2023-10-26 ENCOUNTER — Telehealth: Payer: Self-pay | Admitting: Podiatry

## 2023-10-26 NOTE — Telephone Encounter (Signed)
PT CALLED AND STATED THAT SHE IS READY TO SCHEDULE HER SURGERY WITH DR.STANDIFORD, STATED SHE WAS TOLD TO WAIT TO THE END OF THE YEAR TO HAVE SURGERY. THE BEST NUMBER TO REACH HER IS [phone].

## 2023-11-24 ENCOUNTER — Telehealth: Payer: Self-pay | Admitting: Podiatry

## 2023-11-24 NOTE — Telephone Encounter (Signed)
DOS-12/22/23  Ralene Bathe [id number] LAPIDUS PROCEDURE INCLUDING BUNIONECTOMY [id number]  [employer] EFFECTIVE DATE-06/06/2018  DEDUCTIBLE- $300.00 WITH REMAINING $210.80 OOP-$1500.00 WITH REMAINING $1155.80 COINSURANCE- 0%  PER THE [employer] WEBSITE PORTAL, PRIOR AUTH HAS BEEN APPROVED FOR CPT CODES 29562 AND [phone]. GOOD FROM 12/22/23 - 02/19/24  AUTH Order ID: [id number] ACMP #: [id number]

## 2023-12-13 ENCOUNTER — Telehealth: Payer: Self-pay | Admitting: Podiatry

## 2023-12-13 NOTE — Telephone Encounter (Signed)
 Completed STD paperwork for [employer] ....   Faxed to [phone].  Called patient and advised the same ....    J. Abbott -- 12/13/2023

## 2023-12-22 ENCOUNTER — Other Ambulatory Visit: Payer: Self-pay | Admitting: Podiatry

## 2023-12-22 DIAGNOSIS — M2011 Hallux valgus (acquired), right foot: Secondary | ICD-10-CM | POA: Diagnosis not present

## 2023-12-22 MED ORDER — IBUPROFEN 800 MG PO TABS: 800.0000 mg | ORAL_TABLET | Freq: Three times a day (TID) | ORAL | 1 refills | Status: AC | PRN

## 2023-12-22 MED ORDER — CEPHALEXIN 500 MG PO CAPS: 500.0000 mg | ORAL_CAPSULE | Freq: Three times a day (TID) | ORAL | 0 refills | Status: AC

## 2023-12-22 MED ORDER — OXYCODONE-ACETAMINOPHEN 5-325 MG PO TABS: 1.0000 | ORAL_TABLET | ORAL | 0 refills | Status: DC | PRN

## 2023-12-27 ENCOUNTER — Telehealth: Payer: Self-pay

## 2023-12-27 ENCOUNTER — Other Ambulatory Visit: Payer: Self-pay | Admitting: Podiatry

## 2023-12-27 MED ORDER — OXYCODONE-ACETAMINOPHEN 5-325 MG PO TABS: 1.0000 | ORAL_TABLET | ORAL | 0 refills | Status: AC | PRN

## 2023-12-27 NOTE — Progress Notes (Signed)
Oxy refill sent

## 2023-12-27 NOTE — Telephone Encounter (Signed)
Patient called and left a message - requesting refill on pain medicine

## 2023-12-28 ENCOUNTER — Ambulatory Visit (INDEPENDENT_AMBULATORY_CARE_PROVIDER_SITE_OTHER): Payer: BC Managed Care – PPO

## 2023-12-28 ENCOUNTER — Encounter: Payer: BC Managed Care – PPO | Admitting: Podiatry

## 2023-12-28 ENCOUNTER — Telehealth: Payer: Self-pay | Admitting: Podiatry

## 2023-12-28 ENCOUNTER — Ambulatory Visit (INDEPENDENT_AMBULATORY_CARE_PROVIDER_SITE_OTHER): Payer: BC Managed Care – PPO | Admitting: Podiatry

## 2023-12-28 DIAGNOSIS — Z9889 Other specified postprocedural states: Secondary | ICD-10-CM

## 2023-12-28 DIAGNOSIS — M778 Other enthesopathies, not elsewhere classified: Secondary | ICD-10-CM

## 2023-12-28 DIAGNOSIS — M21611 Bunion of right foot: Secondary | ICD-10-CM

## 2023-12-28 NOTE — Telephone Encounter (Signed)
Patient would like to have a prescription for a Knee Scooter.

## 2023-12-28 NOTE — Progress Notes (Signed)
  Subjective:  Patient ID: Anna Sosa, female    DOB: February 24, 1971,  MRN: [id number]   DOS: 12/22/2023 Procedure: [name] bunionectomy with Akin osteotomy right foot  53 y.o. female seen for post op check.  Patient reports she has had pain since the surgery.  She has been taking oxycodone.  Called for refill.  She is feeling pretty well today though.  Has remained nonweightbearing in a cam boot.  Using a walker/knee scooter to get around.  Review of Systems: Negative except as noted in the HPI. Denies N/V/F/Ch.   Objective:  There were no vitals filed for this visit. There is no height or weight on file to calculate BMI. Constitutional Well developed. Well nourished.  Vascular Foot warm and well perfused. Capillary refill normal to all digits.   No calf pain with palpation  Neurologic Normal speech. Oriented to person, place, and time. Epicritic sensation tact right foot  Dermatologic Incisions healing well there is mild dehiscence of the lateral release incision noted at the dorsal lateral aspect of the first MPJ.  However there is good healthy tissue and no deep wound present.  No drainage.   Orthopedic: Status post left foot bunionectomy.  Edema noted of the left foot.  Mildly tender to palpation.   Radiographs: 12/28/2023 XR 3 views AP lateral oblique of the left foot nonweightbearing.  Findings: Hardware intact at the first TMT J with 2 plates 1 for prominent 1 to prolong spanning the joint with good compression.  First metatarsal is improved in alignment with decreasing her first intermetatarsal angle.  Hallux with 1 surgical staple present with Aiken osteotomy closed with hallux and more rectus alignment with the first metatarsal head.  No acute postop complication noted.  Pathology: N/A  Micro: N/A  Assessment:   1. Capsulitis of foot    Plan:  Patient was evaluated and treated and all questions answered.  POD # 6 s/p left foot Lapidus and Aiken bunionectomy -Progressing as  expected postoperatively.  Patient is doing well with no acute concerns -Recommend baby aspirin twice daily for DVT prophylaxis patient is aware -XR: As above no acute osseous complication noted -WB Status: Nonweightbearing in cam boot until next appointment -Sutures: Suture tails to remain intact.. -Medications/ABX: Ibuprofen and oxycodone as needed for pain DVT prophylaxis as above -Foot redressed. - Return in 1 week for suture tail removal and may transition the patient to weightbearing in cam boot at that time.        Corinna Gab, DPM [facility] / [facility]

## 2024-01-03 ENCOUNTER — Other Ambulatory Visit: Payer: Self-pay

## 2024-01-03 DIAGNOSIS — M21611 Bunion of right foot: Secondary | ICD-10-CM

## 2024-01-03 DIAGNOSIS — Z9889 Other specified postprocedural states: Secondary | ICD-10-CM

## 2024-01-06 ENCOUNTER — Ambulatory Visit: Payer: BC Managed Care – PPO | Admitting: Podiatry

## 2024-01-06 DIAGNOSIS — M21611 Bunion of right foot: Secondary | ICD-10-CM

## 2024-01-06 DIAGNOSIS — Z9889 Other specified postprocedural states: Secondary | ICD-10-CM

## 2024-01-06 NOTE — Progress Notes (Signed)
  Subjective:  Patient ID: Anna Sosa, female    DOB: 03/12/1971,  MRN: [id number]   DOS: 12/22/2023 Procedure: [name] bunionectomy with Akin osteotomy right foot  53 y.o. female seen for post op check.  Patient reports her pain is decreasing.  She is taking ibuprofen for pain.  She has been putting some weight down in the cam boot.  Has a walker to assist ambulation.  Review of Systems: Negative except as noted in the HPI. Denies N/V/F/Ch.   Objective:  There were no vitals filed for this visit. There is no height or weight on file to calculate BMI. Constitutional Well developed. Well nourished.  Vascular Foot warm and well perfused. Capillary refill normal to all digits.   No calf pain with palpation  Neurologic Normal speech. Oriented to person, place, and time. Epicritic sensation tact right foot  Dermatologic Incisions healing well there is mild dehiscence of the lateral release incision noted at the dorsal lateral aspect of the first MPJ.  However there is good healthy tissue and no deep wound present.  No drainage.   Orthopedic: Status post left foot bunionectomy.  Edema noted of the left foot.  Mildly tender to palpation.   Radiographs: Deferred today  Pathology: N/A  Micro: N/A  Assessment:   1. Post-operative state   2. Bunion of right foot     Plan:  Patient was evaluated and treated and all questions answered.  2 weeks s/p left foot Lapidus and Aiken bunionectomy -Progressing as expected postoperatively.  Patient is doing well with no acute concerns, continues to progress -Recommend continued wound care for the small dehiscence over the dorsal lateral aspect of the first MPJ at site of the lateral release incision. -Removed Steri-Strips and suture tails from incisions -XR: Deferred today -WB Status: Nonweightbearing in cam boot until next appointment -Sutures: Removed suture tails in total -Medications/ABX: Ibuprofen as needed for pain continue aspirin twice  daily -Foot redressed.  Okay to get the foot wet and wash with warm soapy water however should apply dressing for another week or so. - Return in 2 weeks for further follow-up and hopeful transition to more weightbearing at that time        Corinna Gab, DPM [facility] / [facility]

## 2024-01-19 ENCOUNTER — Telehealth: Payer: Self-pay | Admitting: Podiatry

## 2024-01-19 DIAGNOSIS — M79673 Pain in unspecified foot: Secondary | ICD-10-CM

## 2024-01-19 NOTE — Telephone Encounter (Signed)
[employer] faxed STD documentation in regards to surgery (12/22/23)   faxed completed form back to [phone]      billed $10.00 for form completion     [facility]   01/19/24

## 2024-02-03 ENCOUNTER — Ambulatory Visit (INDEPENDENT_AMBULATORY_CARE_PROVIDER_SITE_OTHER): Payer: BC Managed Care – PPO

## 2024-02-03 ENCOUNTER — Ambulatory Visit (INDEPENDENT_AMBULATORY_CARE_PROVIDER_SITE_OTHER): Payer: BC Managed Care – PPO | Admitting: Podiatry

## 2024-02-03 DIAGNOSIS — M21611 Bunion of right foot: Secondary | ICD-10-CM

## 2024-02-03 DIAGNOSIS — M778 Other enthesopathies, not elsewhere classified: Secondary | ICD-10-CM

## 2024-02-03 DIAGNOSIS — Z9889 Other specified postprocedural states: Secondary | ICD-10-CM

## 2024-02-03 NOTE — Progress Notes (Signed)
  Subjective:  Patient ID: Anna Sosa, female    DOB: Apr 17, 1971,  MRN: [id number]   DOS: 12/22/2023 Procedure: [name] bunionectomy with Akin osteotomy right foot  53 y.o. female seen for post op check.  Patient reports decreased pain she is walking not having many concerns at this time.  Of note she is walking against recommendation in the cam boot.  Previously had told her to stay nonweightbearing in the cam boot until after this appointment however she walked in with no assistive device like crutches or knee scooter in the cam boot.  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  Review of Systems: Negative except as noted in the HPI. Denies N/V/F/Ch.   Objective:  There were no vitals filed for this visit. There is no height or weight on file to calculate BMI. Constitutional Well developed. Well nourished.  Vascular Foot warm and well perfused. Capillary refill normal to all digits.   No calf pain with palpation  Neurologic Normal speech. Oriented to person, place, and time. Epicritic sensation tact right foot  Dermatologic Incisions well-healed minimal scarring improved from prior  Orthopedic: Status post left foot bunionectomy.  Decreased edema noted of the left foot.  Mildly tender to palpation.   Radiographs: 02/03/2024 XR 3 views AP lateral oblique of the left foot.  Findings: Attention directed to the left foot there is noted to be maintained alignment of the first metatarsal hardware is intact including 2 staples at the first TMT J.  No gapping noted on lateral view.  There is dorsal elevation of the distal first metatarsal which appears much increased from prior radiograph a month ago.  Aiken staple intact appears to be healing at the proximal hallux.  Minimal osseous infill seen across the first TMT J at this time though difficult to assess with these views  Pathology: N/A  Micro: N/A  Assessment:   1. Postoperative state   2. Bunion of right  foot     Plan:  Patient was evaluated and treated and all questions answered.  6 weeks s/p left foot Lapidus and Aiken bunionectomy -Continues to progress though concerned that the patient presented weightbearing against prior recommendations -Recommend against weightbearing for another 2 weeks and then okay to weight-bear in cam boot -XR: Hardware intact and position maintained though possible slight dorsal elevation of the first metatarsal distally.  Possibly related to patient noncompliance with weightbearing status early in the postoperative course -WB Status: Nonweightbearing in cam boot 2 more weeks and then weightbearing as tolerated -Medications/ABX: Ibuprofen as needed No dressing required at this time - Return in 4 weeks for further follow-up        Corinna Gab, DPM [facility] / [facility]

## 2024-02-11 ENCOUNTER — Telehealth: Payer: Self-pay | Admitting: Podiatry

## 2024-02-11 DIAGNOSIS — M21611 Bunion of right foot: Secondary | ICD-10-CM

## 2024-02-11 NOTE — Telephone Encounter (Signed)
 Faxed [employer] STD forms to [phone]

## 2024-02-17 ENCOUNTER — Telehealth: Payer: Self-pay | Admitting: Podiatry

## 2024-02-17 ENCOUNTER — Encounter: Payer: Self-pay | Admitting: Podiatry

## 2024-02-17 NOTE — Telephone Encounter (Signed)
 Patient called back to advise of date of visit needed to be corrected on the RTW letter. Corrected and refaxed and emailed her corrected.

## 2024-02-17 NOTE — Telephone Encounter (Signed)
 Called pt bck at [phone] and advised her of calls on [phone] were not coming to me, but got her message and faxed [employer] [phone] updated note of RTW updated to 03/08/24. She requested copy emailed to her at [email or web address]

## 2024-02-21 ENCOUNTER — Telehealth: Payer: Self-pay | Admitting: Podiatry

## 2024-02-21 NOTE — Telephone Encounter (Signed)
 Recd fax from [employer] requesting last 2 visits notes. Faxed to [phone]

## 2024-03-02 ENCOUNTER — Ambulatory Visit (INDEPENDENT_AMBULATORY_CARE_PROVIDER_SITE_OTHER): Payer: BC Managed Care – PPO | Admitting: Podiatry

## 2024-03-02 ENCOUNTER — Encounter: Payer: Self-pay | Admitting: Podiatry

## 2024-03-02 VITALS — Ht 60.0 in | Wt 200.0 lb

## 2024-03-02 DIAGNOSIS — Z9889 Other specified postprocedural states: Secondary | ICD-10-CM

## 2024-03-02 DIAGNOSIS — M21611 Bunion of right foot: Secondary | ICD-10-CM

## 2024-03-02 NOTE — Progress Notes (Unsigned)
  Subjective:  Patient ID: Anna Sosa, female    DOB: 1971/01/02,  MRN: [id number]   DOS: 12/22/2023 Procedure: [name] bunionectomy with Akin osteotomy right foot  53 y.o. female seen for post op check.  2.5 mo s/p surgery. Repots she has been walking in boot. Denies pain but does report if she trys to wear shoe it hurts on top of foot near midfoot. Otherwise no complaints, eager to start PT.  Review of Systems: Negative except as noted in the HPI. Denies N/V/F/Ch.   Objective:  There were no vitals filed for this visit. Body mass index is 39.06 kg/m. Constitutional Well developed. Well nourished.  Vascular Foot warm and well perfused. Capillary refill normal to all digits.   No calf pain with palpation  Neurologic Normal speech. Oriented to person, place, and time. Epicritic sensation tact right foot  Dermatologic Incisions well-healed minimal scarring improved from prior  Orthopedic: Status post left foot bunionectomy.  Decreased edema noted of the left foot.  Mildly tender to palpation dorsal 1st TMTJ.   Radiographs: Deferred today  Pathology: N/A  Micro: N/A  Assessment:   1. Postoperative state   2. Bunion of right foot      Plan:  Patient was evaluated and treated and all questions answered.  2.5 mo s/p left foot Lapidus and Aiken bunionectomy -Continues to progress slowly - Recommend CAM boot for 2 weeks and then proceed with transition to regular good walking shoes - Referral to PT -XR: Deferred will take next visit -WB Status: WBAT  -Medications/ABX: Ibuprofen as needed - Return in 6 weeks for further follow-up        Corinna Gab, DPM [facility] / [facility]

## 2024-03-09 ENCOUNTER — Telehealth: Payer: Self-pay | Admitting: Podiatry

## 2024-03-09 NOTE — Telephone Encounter (Signed)
 Call inquiring about her RTW forms.

## 2024-03-14 ENCOUNTER — Telehealth: Payer: Self-pay | Admitting: Podiatry

## 2024-03-14 DIAGNOSIS — Z0271 Encounter for disability determination: Secondary | ICD-10-CM

## 2024-03-14 NOTE — Telephone Encounter (Signed)
 s/w about STD forms from [employer]. She is remote only 03/20/24-04/17/24. faxed to [phone] and emailed pt for her records. Will revisit after appt next mth and will update RTW with restrictions.

## 2024-03-15 ENCOUNTER — Ambulatory Visit: Admitting: Physical Therapy

## 2024-03-15 NOTE — Therapy (Deleted)
 OUTPATIENT PHYSICAL THERAPY LOWER EXTREMITY EVALUATION  Patient Name: Anna Sosa MRN: [id number] DOB:05-22-1971, 53 y.o., female Today's Date: 03/15/2024    No past medical history on file. Past Surgical History:  Procedure Laterality Date   COLONOSCOPY N/A 10/04/2019   Procedure: COLONOSCOPY;  Surgeon: West Bali, MD;  [facility]: [facility];  Service: Endoscopy;  Laterality: N/A;  11:00   POLYPECTOMY  10/04/2019   Procedure: POLYPECTOMY;  Surgeon: West Bali, MD;  Location: [facility];  Service: Endoscopy;;  Hepatic Flexure Polyp   TUBAL LIGATION     Patient Active Problem List   Diagnosis Date Noted   Special screening for malignant neoplasms, colon     PCP: Vanessa Nickerson, FNP  REFERRING PROVIDER: Pilar Plate*  THERAPY DIAG:  No diagnosis found.  REFERRING DIAG: Postoperative state [Z98.890], Bunion of right foot [M21.611]   Rationale for Evaluation and Treatment:  Rehabilitation  SUBJECTIVE:  PERTINENT PAST HISTORY:  12/22/2023 Procedure: Lapidus bunionectomy with Akin osteotomy right foot        PRECAUTIONS: {Therapy precautions:24002}  WEIGHT BEARING RESTRICTIONS {Yes ***/No:24003}  FALLS:  Has patient fallen in last 6 months? {yes/no:20286}, Number of falls: ***  MOI/History of condition:  Onset date: ***  SUBJECTIVE STATEMENT  Anna Sosa is a 53 y.o. female who presents to clinic with chief complaint of ***.  ***   Red flags:  {has/denies:26543} {kerredflag:26542}  Pain:  Are you having pain? {yes/no:20286} Pain location: *** NPRS scale:  {NUMBERS; 0-10:5044}/10 to {NUMBERS; 0-10:5044}/10 Aggravating factors: *** Relieving factors: *** Pain description: {PAIN DESCRIPTION:21022940} Stage: {Desc; acute/subacute/chronic:13799} 24 hour pattern: ***   Occupation: ***  Assistive Device: ***  Hand Dominance: ***  Patient Goals/Specific Activities: ***   OBJECTIVE:   DIAGNOSTIC FINDINGS:  ***  GENERAL  OBSERVATION/GAIT:  ***  SENSATION: Light touch: {intact/deficits:24005}  PALPATION: ***  MUSCLE LENGTH: Hamstrings: Right {kerminsig:27227} restriction; Left {kerminsig:27227} restriction Maisie Fus test: Right {kerminsig:27227} restriction; Left {kerminsig:27227} restriction Ely's test: Right {kerminsig:27227} restriction; Left {kerminsig:27227} restriction  LE MMT:  MMT Right (Eval) Left (Eval)  Hip flexion (L2, L3) *** ***  Knee extension (L3) *** ***  Knee flexion *** ***  Hip abduction *** ***  Hip extension *** ***  Hip external rotation    Hip internal rotation    Hip adduction    Ankle dorsiflexion (L4) *** ***  Ankle plantarflexion (S1) *** ***  Ankle inversion *** ***  Ankle eversion *** ***  Great Toe ext (L5)    Grossly     (Blank rows = not tested, score listed is out of 5 possible points.  N = WNL, D = diminished, C = clear for gross weakness with myotome testing, * = concordant pain with testing)  LE ROM:  ROM Right (Eval) Left (Eval)  Hip flexion    Hip extension    Hip abduction    Hip adduction    Hip internal rotation    Hip external rotation    Knee extension    Knee flexion    Ankle dorsiflexion *** ***  Ankle plantarflexion *** ***  Ankle inversion *** ***  Ankle eversion *** ***   (Blank rows = not tested, N = WNL, * = concordant pain with testing)  Functional Tests  Eval    Progressive balance screen (highest level completed for >/= 10''):  Feet together: ***'' Semi Tandem: R in rear ***'', L in rear ***'' Tandem: R in rear ***'', L in rear ***'' SLS: R ***'', L ***''  SPECIAL TESTS:  ***   PATIENT SURVEYS:  ***   TODAY'S TREATMENT: ***   PATIENT EDUCATION:  POC, diagnosis, prognosis, HEP, and outcome measures.  Pt educated via explanation, demonstration, and handout (HEP).  Pt confirms understanding verbally.   HOME EXERCISE  PROGRAM: ***  Treatment priorities   Eval                                                  ASSESSMENT:  CLINICAL IMPRESSION: Anna Sosa is a 53 y.o. female who presents to clinic with signs and sxs consistent with ***.    OBJECTIVE IMPAIRMENTS: Pain, ***  ACTIVITY LIMITATIONS: ***  PERSONAL FACTORS: See medical history and pertinent history   REHAB POTENTIAL: {rehabpotential:25112}  CLINICAL DECISION MAKING: {clinical decision making:25114}  EVALUATION COMPLEXITY: {Evaluation complexity:25115}   GOALS:   SHORT TERM GOALS: Target date: ***  Anna Sosa will be >75% HEP compliant to improve carryover between sessions and facilitate independent management of condition  Evaluation: ongoing Goal status: INITIAL   LONG TERM GOALS: Target date: ***  Anna Sosa will self report >/= 50% decrease in pain from evaluation to improve function in daily tasks  Evaluation/Baseline: ***/10 max pain Goal status: INITIAL   2.  ***   3.  ***   4.  ***   5.  ***   6.  ***   PLAN: PT FREQUENCY: 1-2x/week  PT DURATION: 8 weeks  PLANNED INTERVENTIONS:  97164- PT Re-evaluation, 97110-Therapeutic exercises, 97530- Therapeutic activity, 97112- Neuromuscular re-education, 97535- Self Care, 40981- Manual therapy, [id number]- Gait training, [id number]- Aquatic Therapy, [id number]- Electrical stimulation (manual), [id number]- Vasopneumatic device, [id number]- Traction (mechanical), [id number]- Ionotophoresis 4mg /ml Dexamethasone, Taping, Dry Needling, Joint manipulation, and Spinal manipulation.   Alphonzo Severance PT, DPT 03/15/2024, 12:56 PM

## 2024-03-16 ENCOUNTER — Ambulatory Visit: Attending: Podiatry

## 2024-03-16 ENCOUNTER — Other Ambulatory Visit: Payer: Self-pay

## 2024-03-16 DIAGNOSIS — M79671 Pain in right foot: Secondary | ICD-10-CM

## 2024-03-16 DIAGNOSIS — M6281 Muscle weakness (generalized): Secondary | ICD-10-CM | POA: Diagnosis present

## 2024-03-16 DIAGNOSIS — R2689 Other abnormalities of gait and mobility: Secondary | ICD-10-CM

## 2024-03-16 NOTE — Therapy (Signed)
 OUTPATIENT PHYSICAL THERAPY LOWER EXTREMITY EVALUATION   Patient Name: Anna Sosa MRN: [id number] DOB:1971/06/28, 53 y.o., female Today's Date: 03/17/2024  END OF SESSION:  PT End of Session - 03/16/24 1627     Visit Number 1    Number of Visits 12    Date for PT Re-Evaluation 05/16/24    Authorization Type BCBS    PT Start Time 1630   LAte for session   PT Stop Time 1700    PT Time Calculation (min) 30 min    Activity Tolerance Patient tolerated treatment well    Behavior During Therapy [facility] for tasks assessed/performed             History reviewed. No pertinent past medical history. Past Surgical History:  Procedure Laterality Date   COLONOSCOPY N/A 10/04/2019   Procedure: COLONOSCOPY;  Surgeon: West Bali, MD;  [facility];  Service: Endoscopy;  Laterality: N/A;  11:00   POLYPECTOMY  10/04/2019   Procedure: POLYPECTOMY;  Surgeon: West Bali, MD;  [facility];  Service: Endoscopy;;  Hepatic Flexure Polyp   TUBAL LIGATION     Patient Active Problem List   Diagnosis Date Noted   Special screening for malignant neoplasms, colon     PCP: Vanessa Conrad, FNP   REFERRING PROVIDER: Pilar Plate, DPM  REFERRING DIAG: [phone] (ICD-10-CM) - Postoperative state M21.611 (ICD-10-CM) - Bunion of right foot  THERAPY DIAG:  Pain in right foot  Other abnormalities of gait and mobility  Muscle weakness (generalized)  Rationale for Evaluation and Treatment: Rehabilitation  ONSET DATE: DOS: 12/22/2023 Procedure: Lapidus bunionectomy with Akin osteotomy right foot  SUBJECTIVE:   SUBJECTIVE STATEMENT: Patient arrives to OPP[facility] wearing walking boot, she is WBAT and to begin weaning out of boot.    PERTINENT HISTORY: 2.5 mo s/p left foot Lapidus and Aiken bunionectomy -Continues to progress slowly - Recommend CAM boot for 2 weeks and then proceed with transition to regular good walking shoes - Referral to PT -XR: Deferred will  take next visit -WB Status: WBAT  -Medications/ABX: Ibuprofen as needed - Return in 6 weeks for further follow-up PAIN:  Are you having pain? Yes: NPRS scale: 6/10 Pain location: R foot Pain description: ache Aggravating factors: wearing shoes Relieving factors: rest  PRECAUTIONS: None  RED FLAGS: None   WEIGHT BEARING RESTRICTIONS:  WBAT  FALLS:  Has patient fallen in last 6 months? No  OCCUPATION: not working  PLOF: Independent  PATIENT GOALS: To return to walking with a shoe  NEXT MD VISIT: 6 weeks  OBJECTIVE:  Note: Objective measures were completed at Evaluation unless otherwise noted.  DIAGNOSTIC FINDINGS: none available  PATIENT SURVEYS:  LEFS 8/80  MUSCLE LENGTH: deferred  POSTURE: deferred  PALPATION: TTP across surgical site  LOWER EXTREMITY ROM:  A/PROM Right eval Left eval  Hip flexion    Hip extension    Hip abduction    Hip adduction    Hip internal rotation    Hip external rotation    Knee flexion    Knee extension    Ankle dorsiflexion 0/8d   Ankle plantarflexion WFL   Ankle inversion WFL   Ankle eversion WFL   Great toe extension 30d 60d   (Blank rows = not tested)  LOWER EXTREMITY MMT:  MMT Right eval Left eval  Hip flexion    Hip extension    Hip abduction    Hip adduction    Hip internal rotation  Hip external rotation    Knee flexion    Knee extension    Ankle dorsiflexion 3   Ankle plantarflexion 2+   Ankle inversion 3   Ankle eversion 3    (Blank rows = not tested)  LOWER EXTREMITY SPECIAL TESTS:  deferred  FUNCTIONAL TESTS:  30 seconds chair stand test  GAIT: Distance walked: 62ftx2 Assistive device utilized:  CAM boot Level of assistance: Complete Independence Comments: sloe cadence                                                                                                                                TREATMENT DATE:  [facility] Adult PT Treatment:                                                 DATE: 03/16/24 Eval (due to time constraints)   PATIENT EDUCATION:  Education details: Discussed eval findings, rehab rationale and POC and patient is in agreement  Person educated: Patient Education method: Explanation Education comprehension: verbalized understanding and needs further education  HOME EXERCISE PROGRAM: TBD  ASSESSMENT:  CLINICAL IMPRESSION: Patient is a 53 y.o. female who was seen today for physical therapy evaluation and treatment to address ROM, strength and functional deficits following R bunion surgery 12/22/23.  She is WBAT in a CAM boot and will begin to transition into a shoe ASAP.  Ankle ROM restricted in DF only, mobility restrictions identified in R great toe.  Patient able to SLS on R but <10s.  OBJECTIVE IMPAIRMENTS: Abnormal gait, decreased activity tolerance, decreased balance, decreased knowledge of condition, decreased knowledge of use of DME, decreased mobility, difficulty walking, decreased ROM, decreased strength, increased fascial restrictions, impaired perceived functional ability, and pain.   ACTIVITY LIMITATIONS: carrying, lifting, sitting, standing, squatting, stairs, and transfers  PERSONAL FACTORS: Behavior pattern, Fitness, and Time since onset of injury/illness/exacerbation are also affecting patient's functional outcome.   REHAB POTENTIAL: Good  CLINICAL DECISION MAKING: Evolving/moderate complexity  EVALUATION COMPLEXITY: Low   GOALS: Goals reviewed with patient? No  SHORT TERM GOALS: Target date: 04/06/2024   Patient to demonstrate independence in HEP  Baseline: TBD Goal status: INITIAL  2.  Assess 2 MWT and establish baseline Baseline: TBD Goal status: INITIAL   LONG TERM GOALS: Target date: 04/27/2024  Patient will increase 30s chair stand reps from 1 to 8 with/without arms to demonstrate and improved functional ability with less pain/difficulty as well as reduce fall risk.  Baseline:  Goal status: INITIAL  2.  Patient  will acknowledge 2/10 pain at least once during episode of care   Baseline: 5/10 Goal status: INITIAL  3.  Patient will score at least 40/80 on FOTO to signify clinically meaningful improvement in functional abilities.   Baseline: 8/80 Goal status: INITIAL  4.  Increase R  ankle strength to 4/5 Baseline:   R   Ankle dorsiflexion 3   Ankle plantarflexion 2+   Ankle inversion 3   Ankle eversion 3    Goal status: INITIAL  5.  10s R SLS Baseline: 3s Goal status: INITIAL  6.  Increase R great toe extension to 45d and R DF to 10d Baseline:   R   Ankle dorsiflexion 0/8d   Ankle plantarflexion WFL   Ankle inversion WFL   Ankle eversion WFL   Great toe extension 10/30d 60d   Goal status: INITIAL   PLAN:  PT FREQUENCY: 2x/week  PT DURATION: 6 weeks  PLANNED INTERVENTIONS: 81191- PT Re-evaluation, 97110-Therapeutic exercises, 97530- Therapeutic activity, 97112- Neuromuscular re-education, 97535- Self Care, 47829- Manual therapy, [phone]- Gait training, [phone]- Aquatic Therapy, Patient/Family education, Balance training, Stair training, Dry Needling, Joint mobilization, Spinal mobilization, Scar mobilization, and DME instructions  PLAN FOR NEXT SESSION: HEP review and update, manual techniques as appropriate, aerobic tasks, ROM and flexibility activities, strengthening and PREs, TPDN, gait and balance training as needed     Hildred Laser, PT 03/17/2024, 8:40 AM

## 2024-03-20 ENCOUNTER — Telehealth: Payer: Self-pay | Admitting: Podiatry

## 2024-03-20 ENCOUNTER — Encounter: Payer: Self-pay | Admitting: Podiatry

## 2024-03-20 NOTE — Telephone Encounter (Signed)
 PATIENT IS REQUESTING SPECIFIED WORK NOTE/RETURN TO WORK WITH BOOT AND CANE. PATIENT IS REQUESTING TELEPHONE CALL AS SOON AS POSSIBLE, [phone]

## 2024-03-20 NOTE — Telephone Encounter (Signed)
 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

## 2024-03-29 ENCOUNTER — Ambulatory Visit

## 2024-03-30 ENCOUNTER — Ambulatory Visit: Admitting: Physical Therapy

## 2024-04-04 ENCOUNTER — Ambulatory Visit: Admitting: Physical Therapy

## 2024-04-04 DIAGNOSIS — M79671 Pain in right foot: Secondary | ICD-10-CM | POA: Diagnosis not present

## 2024-04-04 DIAGNOSIS — R2689 Other abnormalities of gait and mobility: Secondary | ICD-10-CM

## 2024-04-04 DIAGNOSIS — M6281 Muscle weakness (generalized): Secondary | ICD-10-CM

## 2024-04-04 NOTE — Therapy (Addendum)
 OUTPATIENT PHYSICAL THERAPY LOWER EXTREMITY EVALUATION   Patient Name: Anna Sosa MRN: [id number] DOB:January 30, 1971, 53 y.o., female Today's Date: 04/04/2024  END OF SESSION:  PT End of Session - 04/04/24 1656     Visit Number 2    Number of Visits 12    Date for PT Re-Evaluation 05/16/24    PT Start Time 1625    PT Stop Time 1653    PT Time Calculation (min) 28 min              No past medical history on file. Past Surgical History:  Procedure Laterality Date   COLONOSCOPY N/A 10/04/2019   Procedure: COLONOSCOPY;  Surgeon: Alyce Jubilee, MD;  [facility];  Service: Endoscopy;  Laterality: N/A;  11:00   POLYPECTOMY  10/04/2019   Procedure: POLYPECTOMY;  Surgeon: Alyce Jubilee, MD;  [facility];  Service: Endoscopy;;  Hepatic Flexure Polyp   TUBAL LIGATION     Patient Active Problem List   Diagnosis Date Noted   Special screening for malignant neoplasms, colon     PCP: Susana Enter, FNP   REFERRING PROVIDER: Evertt Hoe, DPM  REFERRING DIAG: [phone] (ICD-10-CM) - Postoperative state M21.611 (ICD-10-CM) - Bunion of right foot  THERAPY DIAG:  Pain in right foot  Other abnormalities of gait and mobility  Muscle weakness (generalized)  Rationale for Evaluation and Treatment: Rehabilitation  ONSET DATE: DOS: 12/22/2023 Procedure: Lapidus bunionectomy with Akin osteotomy right foot  SUBJECTIVE:   SUBJECTIVE STATEMENT: Pt attended today's session with reports of 5/10 pain.  When she's sitting still has no pain. Hasn't been to therapy d/t personal matters in the family.   PERTINENT HISTORY: 2.5 mo s/p left foot Lapidus and Aiken bunionectomy -Continues to progress slowly - Recommend CAM boot for 2 weeks and then proceed with transition to regular good walking shoes - Referral to PT -XR: Deferred will take next visit -WB Status: WBAT  -Medications/ABX: Ibuprofen  as needed - Return in 6 weeks for further follow-up PAIN:   Are you having pain? Yes: NPRS scale: 6/10 Pain location: R foot Pain description: ache Aggravating factors: wearing shoes Relieving factors: rest  PRECAUTIONS: None  RED FLAGS: None   WEIGHT BEARING RESTRICTIONS:  WBAT  FALLS:  Has patient fallen in last 6 months? No  OCCUPATION: not working  PLOF: Independent  PATIENT GOALS: To return to walking with a shoe  NEXT MD VISIT: 6 weeks  OBJECTIVE:  Note: Objective measures were completed at Evaluation unless otherwise noted.  DIAGNOSTIC FINDINGS: none available  PATIENT SURVEYS:  LEFS 8/80  MUSCLE LENGTH: deferred  POSTURE: deferred  PALPATION: TTP across surgical site  LOWER EXTREMITY ROM:  A/PROM Right eval Left eval  Hip flexion    Hip extension    Hip abduction    Hip adduction    Hip internal rotation    Hip external rotation    Knee flexion    Knee extension    Ankle dorsiflexion 0/8d   Ankle plantarflexion WFL   Ankle inversion WFL   Ankle eversion WFL   Great toe extension 30d 60d   (Blank rows = not tested)  LOWER EXTREMITY MMT:  MMT Right eval Left eval  Hip flexion    Hip extension    Hip abduction    Hip adduction    Hip internal rotation    Hip external rotation    Knee flexion    Knee extension    Ankle dorsiflexion 3  Ankle plantarflexion 2+   Ankle inversion 3   Ankle eversion 3    (Blank rows = not tested)  LOWER EXTREMITY SPECIAL TESTS:  deferred  FUNCTIONAL TESTS:  30 seconds chair stand test  GAIT: Distance walked: 10ftx2 Assistive device utilized:  CAM boot Level of assistance: Complete Independence Comments: sloe cadence                                                                                                                                TREATMENT DATE:  [facility] Adult PT Treatment:                                                DATE: 04/04/2024  Therapeutic Exercise: NuStep 5' Seated DF soleus stretch with manual overpressure 2x1' Therapeutic  Activity:  SLS on compliant surface w/ UE assist PRN 2x2' Heel raises with UE assist PRN 2x15, 3 hold Standing great toe ext stretch 2x1' Standing Great toe isolation 2x8   [facility] Adult PT Treatment:                                                DATE: 03/16/24 Eval (due to time constraints)   PATIENT EDUCATION:  Education details: Discussed eval findings, rehab rationale and POC and patient is in agreement  Person educated: Patient Education method: Explanation Education comprehension: verbalized understanding and needs further education  HOME EXERCISE PROGRAM: TBD  ASSESSMENT:  CLINICAL IMPRESSION: Pt attended physical therapy session 45m late for continuation of treatment regarding R bunionectomy. Today's treatment focused on improvement of  WB tolerance, ankle/toe mobility, and global ankle strength/stability. Pt showed  good tolerance to administered treatment with no adverse effects by the end of session. Pt required minimal verbal/tactile cuing alongside no physical assistance for safe and appropriate performance of today's activities. Continue to progress within POC as tolerated.   Patient is a 53 y.o. female who was seen today for physical therapy evaluation and treatment to address ROM, strength and functional deficits following R bunion surgery 12/22/23.  She is WBAT in a CAM boot and will begin to transition into a shoe ASAP.  Ankle ROM restricted in DF only, mobility restrictions identified in R great toe.  Patient able to SLS on R but <10s.  OBJECTIVE IMPAIRMENTS: Abnormal gait, decreased activity tolerance, decreased balance, decreased knowledge of condition, decreased knowledge of use of DME, decreased mobility, difficulty walking, decreased ROM, decreased strength, increased fascial restrictions, impaired perceived functional ability, and pain.   ACTIVITY LIMITATIONS: carrying, lifting, sitting, standing, squatting, stairs, and transfers  PERSONAL FACTORS: Behavior  pattern, Fitness, and Time since onset of injury/illness/exacerbation are also affecting patient's functional outcome.   REHAB  POTENTIAL: Good  CLINICAL DECISION MAKING: Evolving/moderate complexity  EVALUATION COMPLEXITY: Low   GOALS: Goals reviewed with patient? No  SHORT TERM GOALS: Target date: 04/06/2024   Patient to demonstrate independence in HEP  Baseline: TBD Goal status: INITIAL  2.  Assess 2 MWT and establish baseline Baseline: TBD Goal status: INITIAL   LONG TERM GOALS: Target date: 04/27/2024  Patient will increase 30s chair stand reps from 1 to 8 with/without arms to demonstrate and improved functional ability with less pain/difficulty as well as reduce fall risk.  Baseline:  Goal status: INITIAL  2.  Patient will acknowledge 2/10 pain at least once during episode of care   Baseline: 5/10 Goal status: INITIAL  3.  Patient will score at least 40/80 on FOTO to signify clinically meaningful improvement in functional abilities.   Baseline: 8/80 Goal status: INITIAL  4.  Increase R ankle strength to 4/5 Baseline:   R   Ankle dorsiflexion 3   Ankle plantarflexion 2+   Ankle inversion 3   Ankle eversion 3    Goal status: INITIAL  5.  10s R SLS Baseline: 3s Goal status: INITIAL  6.  Increase R great toe extension to 45d and R DF to 10d Baseline:   R   Ankle dorsiflexion 0/8d   Ankle plantarflexion WFL   Ankle inversion WFL   Ankle eversion WFL   Great toe extension 10/30d 60d   Goal status: INITIAL   PLAN:  PT FREQUENCY: 2x/week  PT DURATION: 6 weeks  PLANNED INTERVENTIONS: 97164- PT Re-evaluation, 97110-Therapeutic exercises, 97530- Therapeutic activity, 97112- Neuromuscular re-education, 97535- Self Care, 29562- Manual therapy, [phone]- Gait training, [phone]- Aquatic Therapy, Patient/Family education, Balance training, Stair training, Dry Needling, Joint mobilization, Spinal mobilization, Scar mobilization, and DME instructions  PLAN FOR NEXT  SESSION: HEP review and update, manual techniques as appropriate, aerobic tasks, ROM and flexibility activities, strengthening and PREs, TPDN, gait and balance training as needed     Bunny Caroli, PT 04/04/2024, 4:58 PM

## 2024-04-05 NOTE — Therapy (Signed)
 OUTPATIENT PHYSICAL THERAPY TREATMENT NOTE   Patient Name: Anna Sosa MRN: [id number] DOB:03/03/1971, 53 y.o., female Today's Date: 04/06/2024  END OF SESSION:  PT End of Session - 04/06/24 1629     Visit Number 3    Number of Visits 12    Date for PT Re-Evaluation 05/16/24    Authorization Type [employer]    PT Start Time 1630   late for session   PT Stop Time 1700    PT Time Calculation (min) 30 min    Activity Tolerance Patient tolerated treatment well    Behavior During Therapy [facility] for tasks assessed/performed               History reviewed. No pertinent past medical history. Past Surgical History:  Procedure Laterality Date   COLONOSCOPY N/A 10/04/2019   Procedure: COLONOSCOPY;  Surgeon: Alyce Jubilee, MD;  [facility];  Service: Endoscopy;  Laterality: N/A;  11:00   POLYPECTOMY  10/04/2019   Procedure: POLYPECTOMY;  Surgeon: Alyce Jubilee, MD;  [facility];  Service: Endoscopy;;  Hepatic Flexure Polyp   TUBAL LIGATION     Patient Active Problem List   Diagnosis Date Noted   Special screening for malignant neoplasms, colon     PCP: Susana Enter, FNP   REFERRING PROVIDER: Evertt Hoe, DPM  REFERRING DIAG: [phone] (ICD-10-CM) - Postoperative state M21.611 (ICD-10-CM) - Bunion of right foot  THERAPY DIAG:  Pain in right foot  Other abnormalities of gait and mobility  Muscle weakness (generalized)  Rationale for Evaluation and Treatment: Rehabilitation  ONSET DATE: DOS: 12/22/2023 Procedure: Lapidus bunionectomy with Akin osteotomy right foot  SUBJECTIVE:   SUBJECTIVE STATEMENT: Pain levels 4/10 with activity   PERTINENT HISTORY: 2.5 mo s/p left foot Lapidus and Aiken bunionectomy -Continues to progress slowly - Recommend CAM boot for 2 weeks and then proceed with transition to regular good walking shoes - Referral to PT -XR: Deferred will take next visit -WB Status: WBAT  -Medications/ABX: Ibuprofen  as  needed - Return in 6 weeks for further follow-up PAIN:  Are you having pain? Yes: NPRS scale: 6/10 Pain location: R foot Pain description: ache Aggravating factors: wearing shoes Relieving factors: rest  PRECAUTIONS: None  RED FLAGS: None   WEIGHT BEARING RESTRICTIONS:  WBAT  FALLS:  Has patient fallen in last 6 months? No  OCCUPATION: not working  PLOF: Independent  PATIENT GOALS: To return to walking with a shoe  NEXT MD VISIT: 6 weeks  OBJECTIVE:  Note: Objective measures were completed at Evaluation unless otherwise noted.  DIAGNOSTIC FINDINGS: none available  PATIENT SURVEYS:  LEFS 8/80  MUSCLE LENGTH: deferred  POSTURE: deferred  PALPATION: TTP across surgical site  LOWER EXTREMITY ROM:  A/PROM Right eval Left eval  Hip flexion    Hip extension    Hip abduction    Hip adduction    Hip internal rotation    Hip external rotation    Knee flexion    Knee extension    Ankle dorsiflexion 0/8d   Ankle plantarflexion WFL   Ankle inversion WFL   Ankle eversion WFL   Great toe extension 30d 60d   (Blank rows = not tested)  LOWER EXTREMITY MMT:  MMT Right eval Left eval  Hip flexion    Hip extension    Hip abduction    Hip adduction    Hip internal rotation    Hip external rotation    Knee flexion    Knee  extension    Ankle dorsiflexion 3   Ankle plantarflexion 2+   Ankle inversion 3   Ankle eversion 3    (Blank rows = not tested)  LOWER EXTREMITY SPECIAL TESTS:  deferred  FUNCTIONAL TESTS:  30 seconds chair stand test  GAIT: Distance walked: 37ftx2 Assistive device utilized:  CAM boot Level of assistance: Complete Independence Comments: sloe cadence                                                                                                                                TREATMENT DATE:  [facility] Adult PT Treatment:                                                DATE: 04/06/24 Therapeutic Exercise: PF Stretch with towel 60s  x2 Therapeutic Activity: Toe yoga 60s INV/EV 60s on towel Toe crunches 60s 2 MWT 186 ft   [facility] Adult PT Treatment:                                                DATE: 04/04/2024 Therapeutic Exercise: NuStep 5' Seated DF soleus stretch with manual overpressure 2x1' Therapeutic Activity:  SLS on compliant surface w/ UE assist PRN 2x2' Heel raises with UE assist PRN 2x15, 3 hold Standing great toe ext stretch 2x1' Standing Great toe isolation 2x8   [facility] Adult PT Treatment:                                                DATE: 03/16/24 Eval (due to time constraints)   PATIENT EDUCATION:  Education details: Discussed eval findings, rehab rationale and POC and patient is in agreement  Person educated: Patient Education method: Explanation Education comprehension: verbalized understanding and needs further education  HOME EXERCISE PROGRAM: Access Code: [email or web address] URL: [email or web address] Date: 04/06/2024 Prepared by: Gretta Leavens  Exercises - Long Sitting Plantar Fascia Stretch with Towel  - 2-3 x daily - 5 x weekly - 1 sets - 2 reps - 60s hold - Toe Yoga - Alternating Great Toe and Lesser Toe Extension  - 2-3 x daily - 5 x weekly - 1 sets - 1 reps - 60s hold - Towel Scrunches  - 2-3 x daily - 5 x weekly - 1 sets - 1 reps - 60s hold - Ankle Inversion Eversion Towel Slide  - 2-3 x daily - 5 x weekly - 1 sets - 1 reps - 60s hold  ASSESSMENT:  CLINICAL IMPRESSION: Arrives late for session, treatment abbreviated as noted.  Focus of session  was establishment of HEP and written copy issued.  Assessed 2 MWT to establish baseline.   Patient is a 53 y.o. female who was seen today for physical therapy evaluation and treatment to address ROM, strength and functional deficits following R bunion surgery 12/22/23.  She is WBAT in a CAM boot and will begin to transition into a shoe ASAP.  Ankle ROM restricted in DF only, mobility restrictions identified in R great toe.   Patient able to SLS on R but <10s.  OBJECTIVE IMPAIRMENTS: Abnormal gait, decreased activity tolerance, decreased balance, decreased knowledge of condition, decreased knowledge of use of DME, decreased mobility, difficulty walking, decreased ROM, decreased strength, increased fascial restrictions, impaired perceived functional ability, and pain.   ACTIVITY LIMITATIONS: carrying, lifting, sitting, standing, squatting, stairs, and transfers  PERSONAL FACTORS: Behavior pattern, Fitness, and Time since onset of injury/illness/exacerbation are also affecting patient's functional outcome.   REHAB POTENTIAL: Good  CLINICAL DECISION MAKING: Evolving/moderate complexity  EVALUATION COMPLEXITY: Low   GOALS: Goals reviewed with patient? No  SHORT TERM GOALS: Target date: 04/06/2024   Patient to demonstrate independence in HEP  Baseline: TBD; [email or web address] Goal status: INITIAL  2.  Assess 2 MWT and establish baseline Baseline: TBD; 186 ft Goal status: Met   LONG TERM GOALS: Target date: 04/27/2024  Patient will increase 30s chair stand reps from 1 to 8 with/without arms to demonstrate and improved functional ability with less pain/difficulty as well as reduce fall risk.  Baseline:  Goal status: INITIAL  2.  Patient will acknowledge 2/10 pain at least once during episode of care   Baseline: 5/10 Goal status: INITIAL  3.  Patient will score at least 40/80 on FOTO to signify clinically meaningful improvement in functional abilities.   Baseline: 8/80 Goal status: INITIAL  4.  Increase R ankle strength to 4/5 Baseline:   R   Ankle dorsiflexion 3   Ankle plantarflexion 2+   Ankle inversion 3   Ankle eversion 3    Goal status: INITIAL  5.  10s R SLS Baseline: 3s Goal status: INITIAL  6.  Increase R great toe extension to 45d and R DF to 10d Baseline:   R   Ankle dorsiflexion 0/8d   Ankle plantarflexion WFL   Ankle inversion WFL   Ankle eversion WFL   Great toe extension 10/30d  60d   Goal status: INITIAL   PLAN:  PT FREQUENCY: 2x/week  PT DURATION: 6 weeks  PLANNED INTERVENTIONS: 54098- PT Re-evaluation, 97110-Therapeutic exercises, 97530- Therapeutic activity, 97112- Neuromuscular re-education, 97535- Self Care, 11914- Manual therapy, [phone]- Gait training, [phone]- Aquatic Therapy, Patient/Family education, Balance training, Stair training, Dry Needling, Joint mobilization, Spinal mobilization, Scar mobilization, and DME instructions  PLAN FOR NEXT SESSION: HEP review and update, manual techniques as appropriate, aerobic tasks, ROM and flexibility activities, strengthening and PREs, TPDN, gait and balance training as needed     Eldon Greenland, PT 04/06/2024, 4:32 PM

## 2024-04-06 ENCOUNTER — Ambulatory Visit: Attending: Podiatry

## 2024-04-06 DIAGNOSIS — M79671 Pain in right foot: Secondary | ICD-10-CM | POA: Insufficient documentation

## 2024-04-06 DIAGNOSIS — M6281 Muscle weakness (generalized): Secondary | ICD-10-CM | POA: Diagnosis present

## 2024-04-06 DIAGNOSIS — R2689 Other abnormalities of gait and mobility: Secondary | ICD-10-CM | POA: Insufficient documentation

## 2024-04-10 NOTE — Therapy (Deleted)
 OUTPATIENT PHYSICAL THERAPY TREATMENT NOTE   Patient Name: Anna Sosa MRN: [id number] DOB:11/16/1971, 53 y.o., female Today's Date: 04/10/2024  END OF SESSION:      No past medical history on file. Past Surgical History:  Procedure Laterality Date   COLONOSCOPY N/A 10/04/2019   Procedure: COLONOSCOPY;  Surgeon: Alyce Jubilee, MD;  [facility];  Service: Endoscopy;  Laterality: N/A;  11:00   POLYPECTOMY  10/04/2019   Procedure: POLYPECTOMY;  Surgeon: Alyce Jubilee, MD;  [facility];  Service: Endoscopy;;  Hepatic Flexure Polyp   TUBAL LIGATION     Patient Active Problem List   Diagnosis Date Noted   Special screening for malignant neoplasms, colon     PCP: Susana Enter, FNP   REFERRING PROVIDER: Evertt Hoe, DPM  REFERRING DIAG: [phone] (ICD-10-CM) - Postoperative state M21.611 (ICD-10-CM) - Bunion of right foot  THERAPY DIAG:  No diagnosis found.  Rationale for Evaluation and Treatment: Rehabilitation  ONSET DATE: DOS: 12/22/2023 Procedure: Lapidus bunionectomy with Akin osteotomy right foot  SUBJECTIVE:   SUBJECTIVE STATEMENT: Pain levels 4/10 with activity   PERTINENT HISTORY: 2.5 mo s/p left foot Lapidus and Aiken bunionectomy -Continues to progress slowly - Recommend CAM boot for 2 weeks and then proceed with transition to regular good walking shoes - Referral to PT -XR: Deferred will take next visit -WB Status: WBAT  -Medications/ABX: Ibuprofen  as needed - Return in 6 weeks for further follow-up PAIN:  Are you having pain? Yes: NPRS scale: 6/10 Pain location: R foot Pain description: ache Aggravating factors: wearing shoes Relieving factors: rest  PRECAUTIONS: None  RED FLAGS: None   WEIGHT BEARING RESTRICTIONS:  WBAT  FALLS:  Has patient fallen in last 6 months? No  OCCUPATION: not working  PLOF: Independent  PATIENT GOALS: To return to walking with a shoe  NEXT MD VISIT: 6 weeks  OBJECTIVE:   Note: Objective measures were completed at Evaluation unless otherwise noted.  DIAGNOSTIC FINDINGS: none available  PATIENT SURVEYS:  LEFS 8/80  MUSCLE LENGTH: deferred  POSTURE: deferred  PALPATION: TTP across surgical site  LOWER EXTREMITY ROM:  A/PROM Right eval Left eval  Hip flexion    Hip extension    Hip abduction    Hip adduction    Hip internal rotation    Hip external rotation    Knee flexion    Knee extension    Ankle dorsiflexion 0/8d   Ankle plantarflexion WFL   Ankle inversion WFL   Ankle eversion WFL   Great toe extension 30d 60d   (Blank rows = not tested)  LOWER EXTREMITY MMT:  MMT Right eval Left eval  Hip flexion    Hip extension    Hip abduction    Hip adduction    Hip internal rotation    Hip external rotation    Knee flexion    Knee extension    Ankle dorsiflexion 3   Ankle plantarflexion 2+   Ankle inversion 3   Ankle eversion 3    (Blank rows = not tested)  LOWER EXTREMITY SPECIAL TESTS:  deferred  FUNCTIONAL TESTS:  30 seconds chair stand test  GAIT: Distance walked: 77ftx2 Assistive device utilized:  CAM boot Level of assistance: Complete Independence Comments: sloe cadence  TREATMENT DATE:  [facility] Adult PT Treatment:                                                DATE: 04/06/24 Therapeutic Exercise: PF Stretch with towel 60s x2 Therapeutic Activity: Toe yoga 60s INV/EV 60s on towel Toe crunches 60s 2 MWT 186 ft   [facility] Adult PT Treatment:                                                DATE: 04/04/2024 Therapeutic Exercise: NuStep 5' Seated DF soleus stretch with manual overpressure 2x1' Therapeutic Activity:  SLS on compliant surface w/ UE assist PRN 2x2' Heel raises with UE assist PRN 2x15, 3 hold Standing great toe ext stretch 2x1' Standing Great toe isolation 2x8   [facility]  Adult PT Treatment:                                                DATE: 03/16/24 Eval (due to time constraints)   PATIENT EDUCATION:  Education details: Discussed eval findings, rehab rationale and POC and patient is in agreement  Person educated: Patient Education method: Explanation Education comprehension: verbalized understanding and needs further education  HOME EXERCISE PROGRAM: Access Code: [email or web address] URL: [email or web address] Date: 04/06/2024 Prepared by: Gretta Leavens  Exercises - Long Sitting Plantar Fascia Stretch with Towel  - 2-3 x daily - 5 x weekly - 1 sets - 2 reps - 60s hold - Toe Yoga - Alternating Great Toe and Lesser Toe Extension  - 2-3 x daily - 5 x weekly - 1 sets - 1 reps - 60s hold - Towel Scrunches  - 2-3 x daily - 5 x weekly - 1 sets - 1 reps - 60s hold - Ankle Inversion Eversion Towel Slide  - 2-3 x daily - 5 x weekly - 1 sets - 1 reps - 60s hold  ASSESSMENT:  CLINICAL IMPRESSION: Arrives late for session, treatment abbreviated as noted.  Focus of session was establishment of HEP and written copy issued.  Assessed 2 MWT to establish baseline.   Patient is a 53 y.o. female who was seen today for physical therapy evaluation and treatment to address ROM, strength and functional deficits following R bunion surgery 12/22/23.  She is WBAT in a CAM boot and will begin to transition into a shoe ASAP.  Ankle ROM restricted in DF only, mobility restrictions identified in R great toe.  Patient able to SLS on R but <10s.  OBJECTIVE IMPAIRMENTS: Abnormal gait, decreased activity tolerance, decreased balance, decreased knowledge of condition, decreased knowledge of use of DME, decreased mobility, difficulty walking, decreased ROM, decreased strength, increased fascial restrictions, impaired perceived functional ability, and pain.   ACTIVITY LIMITATIONS: carrying, lifting, sitting, standing, squatting, stairs, and transfers  PERSONAL FACTORS: Behavior  pattern, Fitness, and Time since onset of injury/illness/exacerbation are also affecting patient's functional outcome.   REHAB POTENTIAL: Good  CLINICAL DECISION MAKING: Evolving/moderate complexity  EVALUATION COMPLEXITY: Low   GOALS: Goals reviewed with patient? No  SHORT TERM GOALS: Target date: 04/06/2024   Patient to demonstrate independence in HEP  Baseline: TBD; [email or web address] Goal status: INITIAL  2.  Assess 2 MWT and establish baseline Baseline: TBD; 186 ft Goal status: Met   LONG TERM GOALS: Target date: 04/27/2024  Patient will increase 30s chair stand reps from 1 to 8 with/without arms to demonstrate and improved functional ability with less pain/difficulty as well as reduce fall risk.  Baseline:  Goal status: INITIAL  2.  Patient will acknowledge 2/10 pain at least once during episode of care   Baseline: 5/10 Goal status: INITIAL  3.  Patient will score at least 40/80 on FOTO to signify clinically meaningful improvement in functional abilities.   Baseline: 8/80 Goal status: INITIAL  4.  Increase R ankle strength to 4/5 Baseline:   R   Ankle dorsiflexion 3   Ankle plantarflexion 2+   Ankle inversion 3   Ankle eversion 3    Goal status: INITIAL  5.  10s R SLS Baseline: 3s Goal status: INITIAL  6.  Increase R great toe extension to 45d and R DF to 10d Baseline:   R   Ankle dorsiflexion 0/8d   Ankle plantarflexion WFL   Ankle inversion WFL   Ankle eversion WFL   Great toe extension 10/30d 60d   Goal status: INITIAL   PLAN:  PT FREQUENCY: 2x/week  PT DURATION: 6 weeks  PLANNED INTERVENTIONS: 47829- PT Re-evaluation, 97110-Therapeutic exercises, 97530- Therapeutic activity, 97112- Neuromuscular re-education, 97535- Self Care, 56213- Manual therapy, [phone]- Gait training, [phone]- Aquatic Therapy, Patient/Family education, Balance training, Stair training, Dry Needling, Joint mobilization, Spinal mobilization, Scar mobilization, and DME  instructions  PLAN FOR NEXT SESSION: HEP review and update, manual techniques as appropriate, aerobic tasks, ROM and flexibility activities, strengthening and PREs, TPDN, gait and balance training as needed     Eldon Greenland, PT 04/10/2024, 9:21 AM

## 2024-04-11 ENCOUNTER — Ambulatory Visit

## 2024-04-13 ENCOUNTER — Ambulatory Visit (INDEPENDENT_AMBULATORY_CARE_PROVIDER_SITE_OTHER)

## 2024-04-13 ENCOUNTER — Ambulatory Visit: Admitting: Podiatry

## 2024-04-13 ENCOUNTER — Encounter: Payer: Self-pay | Admitting: Physical Therapy

## 2024-04-13 ENCOUNTER — Encounter: Payer: Self-pay | Admitting: Podiatry

## 2024-04-13 ENCOUNTER — Ambulatory Visit: Admitting: Physical Therapy

## 2024-04-13 DIAGNOSIS — M6281 Muscle weakness (generalized): Secondary | ICD-10-CM

## 2024-04-13 DIAGNOSIS — M778 Other enthesopathies, not elsewhere classified: Secondary | ICD-10-CM

## 2024-04-13 DIAGNOSIS — Z9889 Other specified postprocedural states: Secondary | ICD-10-CM

## 2024-04-13 DIAGNOSIS — M21612 Bunion of left foot: Secondary | ICD-10-CM

## 2024-04-13 DIAGNOSIS — R2689 Other abnormalities of gait and mobility: Secondary | ICD-10-CM

## 2024-04-13 DIAGNOSIS — M21611 Bunion of right foot: Secondary | ICD-10-CM | POA: Diagnosis not present

## 2024-04-13 DIAGNOSIS — M79671 Pain in right foot: Secondary | ICD-10-CM

## 2024-04-13 NOTE — Progress Notes (Addendum)
  Subjective:  Patient ID: Anna Sosa, female    DOB: [birth year],  MRN: [id number]   DOS: 12/22/2023 Procedure: Ronold Colder bunionectomy with Akin osteotomy right foot  53 y.o. female seen for post op check.  51mo s/p surgery.  Patient reports she is doing well continues to progress after physical therapy.  She is back to walking in regular shoes.  No concerns says her numbness is gone away pretty much completely and she is walking without pain.  She is interested in possibly doing surgery on the left foot  Review of Systems: Negative except as noted in the HPI. Denies N/V/F/Ch.   Objective:  There were no vitals filed for this visit. There is no height or weight on file to calculate BMI. Constitutional Well developed. Well nourished.  Vascular Foot warm and well perfused. Capillary refill normal to all digits.   No calf pain with palpation  Neurologic Normal speech. Oriented to person, place, and time. Epicritic sensation tact right foot  Dermatologic Incisions well-healed minimal scarring improved from prior  Orthopedic: Status post R foot bunionectomy.  Very good range of motion of the first MPJ no pain on palpation still with mild prominence at the medial aspect of the first met head   Radiographs: 04/13/2024 XR 3 views AP lateral oblique of the right foot weightbearing.  Findings: Full osseous healing noted at the first MPJ and Aiken osteotomy site.  Mild residual hallux valgus deformity though improved from preoperative.    XR 3 views AP lateral oblique left foot weightbearing.  Findings: Hallux valgus deformity noted with increased first metatarsal angle and increased hallux valgus angle less than the right foot was preoperatively.  Prominent first metatarsal head medially  Pathology: N/A  Micro: N/A  Assessment:   1. Bunion of right foot   2. Bunion of left foot   3. Postoperative state   4. Capsulitis of foot      Plan:  Patient was evaluated and treated and all questions  answered.  4 mo s/p Right foot Lapidus and Aiken bunionectomy - Patient satisfied with surgical outcome.  She is no longer having pain and her numbness is decreasing around the base of the 1st and 2nd toes -Fully healed from an osseous perspective - Continue weightbearing as tolerated regular shoes - Continue physical therapy as needed -XR: As above -WB Status: WBAT  -Medications/ABX: Ibuprofen  as needed - Return as needed for the right foot  Left foot hallux with valgus deformity - Patient wishes to have bunion correction done however wishes to have just a shaving procedure at the first metatarsal head. - I discussed that we could proceed with isolated resection of the medial aspect of the first metatarsal head however there is a high chance of residual pain and deformity in the toe following this procedure.  - Also discussed Lapidus versus minimally invasive bunionectomy however she wishes just to have a partial first metatarsal head resection - XR obtain as above - Will discuss surgical planning at further appointment in the future if she wishes to proceed        Maridee Shoemaker, DPM [facility] / [facility]

## 2024-04-13 NOTE — Therapy (Addendum)
 OUTPATIENT PHYSICAL THERAPY TREATMENT NOTE   Patient Name: Jasmyne Lodato MRN: [id number] DOB:[birth year], 53 y.o., female Today's Date: 04/13/2024 PHYSICAL THERAPY DISCHARGE SUMMARY  Visits from Start of Care: 4  Current functional level related to goals / functional outcomes: see assessment   Remaining deficits: see assessment   Education / Equipment: see assessment   Patient agrees to discharge. Patient goals were unknown. Patient is being discharged due to not returning since the last visit.  Albesa Huguenin, PT, DPT 05/11/2024, 11:12 AM    END OF SESSION:  PT End of Session - 04/13/24 1659     Visit Number 4    Number of Visits 12    Date for PT Re-Evaluation 05/16/24    PT Start Time 1700    PT Stop Time 1740    PT Time Calculation (min) 40 min                History reviewed. No pertinent past medical history. Past Surgical History:  Procedure Laterality Date   COLONOSCOPY N/A 10/04/2019   Procedure: COLONOSCOPY;  Surgeon: Alyce Jubilee, MD;  [facility];  Service: Endoscopy;  Laterality: N/A;  11:00   POLYPECTOMY  10/04/2019   Procedure: POLYPECTOMY;  Surgeon: Alyce Jubilee, MD;  Location: [facility];  Service: Endoscopy;;  Hepatic Flexure Polyp   TUBAL LIGATION     Patient Active Problem List   Diagnosis Date Noted   Special screening for malignant neoplasms, colon     PCP: Susana Enter, FNP   REFERRING PROVIDER: Evertt Hoe, DPM  REFERRING DIAG: [phone] (ICD-10-CM) - Postoperative state M21.611 (ICD-10-CM) - Bunion of right foot  THERAPY DIAG:  Pain in right foot  Other abnormalities of gait and mobility  Muscle weakness (generalized)  Rationale for Evaluation and Treatment: Rehabilitation  ONSET DATE: DOS: 12/22/2023 Procedure: Lapidus bunionectomy with Akin osteotomy right foot  SUBJECTIVE:   SUBJECTIVE STATEMENT:  Pt attended today's session with reports of 3/10 pain. Pt stated that they have  maintained good compliance with current HEP.  Getting better every day, moving around makes it feel better.    PERTINENT HISTORY: 2.5 mo s/p left foot Lapidus and Aiken bunionectomy -Continues to progress slowly - Recommend CAM boot for 2 weeks and then proceed with transition to regular good walking shoes - Referral to PT -XR: Deferred will take next visit -WB Status: WBAT  -Medications/ABX: Ibuprofen  as needed - Return in 6 weeks for further follow-up PAIN:  Are you having pain? Yes: NPRS scale: 6/10 Pain location: R foot Pain description: ache Aggravating factors: wearing shoes Relieving factors: rest  PRECAUTIONS: None  RED FLAGS: None   WEIGHT BEARING RESTRICTIONS: WBAT  FALLS:  Has patient fallen in last 6 months? No  OCCUPATION: not working  PLOF: Independent  PATIENT GOALS: To return to walking with a shoe  NEXT MD VISIT: 6 weeks  OBJECTIVE:  Note: Objective measures were completed at Evaluation unless otherwise noted.  DIAGNOSTIC FINDINGS: none available  PATIENT SURVEYS:  LEFS 8/80  MUSCLE LENGTH: deferred  POSTURE: deferred  PALPATION: TTP across surgical site  LOWER EXTREMITY ROM:  A/PROM Right eval Left eval  Hip flexion    Hip extension    Hip abduction    Hip adduction    Hip internal rotation    Hip external rotation    Knee flexion    Knee extension    Ankle dorsiflexion 0/8d   Ankle plantarflexion WFL   Ankle inversion [facility]  Ankle eversion WFL   Great toe extension 30d 60d   (Blank rows = not tested)  LOWER EXTREMITY MMT:  MMT Right eval Left eval  Hip flexion    Hip extension    Hip abduction    Hip adduction    Hip internal rotation    Hip external rotation    Knee flexion    Knee extension    Ankle dorsiflexion 3   Ankle plantarflexion 2+   Ankle inversion 3   Ankle eversion 3    (Blank rows = not tested)  LOWER EXTREMITY SPECIAL TESTS:  deferred  FUNCTIONAL TESTS:  30 seconds chair stand  test  GAIT: Distance walked: 27ftx2 Assistive device utilized: CAM boot Level of assistance: Complete Independence Comments: sloe cadence                                                                                                                                TREATMENT: [facility] Adult PT Treatment:                                                DATE: 04/13/2024  Therapeutic Activity: NuStep 8', activity tolerance Standing toe yoga 3x1' Standing toe crunches  2x1' Standing INV/EV  60s on towel Calf stretch 2x2' B Standing heel raise 3x12, hold 3s UE assist PRN    [facility] Adult PT Treatment:                                                DATE: 04/06/24 Therapeutic Exercise: PF Stretch with towel 60s x2 Therapeutic Activity: Toe yoga 60s INV/EV 60s on towel Toe crunches 60s 2 MWT 186 ft   [facility] Adult PT Treatment:                                                DATE: 04/04/2024 Therapeutic Exercise: NuStep 5' Seated DF soleus stretch with manual overpressure 2x1' Therapeutic Activity:  SLS on compliant surface w/ UE assist PRN 2x2' Heel raises with UE assist PRN 2x15, 3 hold Standing great toe ext stretch 2x1' Standing Great toe isolation 2x8   [facility] Adult PT Treatment:                                                DATE: 03/16/24 Eval (due to time constraints)   PATIENT EDUCATION:  Education details: Discussed eval findings, rehab rationale and POC  and patient is in agreement  Person educated: Patient Education method: Explanation Education comprehension: verbalized understanding and needs further education  HOME EXERCISE PROGRAM: Access Code: [email or web address] URL: [email or web address] Date: 04/06/2024 Prepared by: Gretta Leavens  Exercises - Long Sitting Plantar Fascia Stretch with Towel  - 2-3 x daily - 5 x weekly - 1 sets - 2 reps - 60s hold - Toe Yoga - Alternating Great Toe and Lesser Toe Extension  - 2-3 x daily - 5 x weekly - 1 sets - 1 reps -  60s hold - Towel Scrunches  - 2-3 x daily - 5 x weekly - 1 sets - 1 reps - 60s hold - Ankle Inversion Eversion Towel Slide  - 2-3 x daily - 5 x weekly - 1 sets - 1 reps - 60s hold  ASSESSMENT:  CLINICAL IMPRESSION:  Pt attended physical therapy session for continuation of treatment regarding R bunion surgery. Today's treatment focused on improvement of  WB tolerance, ankle/toe mobility, and intrinsic strength. Pt showed great tolerance to administered treatment with no adverse effects by the end of session. Pt had a reduction in pain as activity progressed. Pt required minimal verbal/tactile cuing alongside no physical assistance for safe and appropriate performance of today's activities. Continue to progress wb activities as tolerated.   Patient is a 53 y.o. female who was seen today for physical therapy evaluation and treatment to address ROM, strength and functional deficits following R bunion surgery 12/22/23.  She is WBAT in a CAM boot and will begin to transition into a shoe ASAP.  Ankle ROM restricted in DF only, mobility restrictions identified in R great toe.  Patient able to SLS on R but <10s.  OBJECTIVE IMPAIRMENTS: Abnormal gait, decreased activity tolerance, decreased balance, decreased knowledge of condition, decreased knowledge of use of DME, decreased mobility, difficulty walking, decreased ROM, decreased strength, increased fascial restrictions, impaired perceived functional ability, and pain.   ACTIVITY LIMITATIONS: carrying, lifting, sitting, standing, squatting, stairs, and transfers  PERSONAL FACTORS: Behavior pattern, Fitness, and Time since onset of injury/illness/exacerbation are also affecting patient's functional outcome.   REHAB POTENTIAL: Good  CLINICAL DECISION MAKING: Evolving/moderate complexity  EVALUATION COMPLEXITY: Low   GOALS: Goals reviewed with patient? No  SHORT TERM GOALS: Target date: 04/06/2024   Patient to demonstrate independence in HEP   Baseline: TBD; [id number] Goal status: INITIAL  2.  Assess 2 MWT and establish baseline Baseline: TBD; 186 ft Goal status: Met   LONG TERM GOALS: Target date: 04/27/2024  Patient will increase 30s chair stand reps from 1 to 8 with/without arms to demonstrate and improved functional ability with less pain/difficulty as well as reduce fall risk.  Baseline:  Goal status: INITIAL  2.  Patient will acknowledge 2/10 pain at least once during episode of care   Baseline: 5/10 Goal status: INITIAL  3.  Patient will score at least 40/80 on FOTO to signify clinically meaningful improvement in functional abilities.   Baseline: 8/80 Goal status: INITIAL  4.  Increase R ankle strength to 4/5 Baseline:   R   Ankle dorsiflexion 3   Ankle plantarflexion 2+   Ankle inversion 3   Ankle eversion 3    Goal status: INITIAL  5.  10s R SLS Baseline: 3s Goal status: INITIAL  6.  Increase R great toe extension to 45d and R DF to 10d Baseline:   R   Ankle dorsiflexion 0/8d   Ankle plantarflexion WFL   Ankle inversion [facility]   Ankle eversion [facility]  Great toe extension 10/30d 60d   Goal status: INITIAL   PLAN:  PT FREQUENCY: 2x/week  PT DURATION: 6 weeks  PLANNED INTERVENTIONS: 97164- PT Re-evaluation, 97110-Therapeutic exercises, 97530- Therapeutic activity, 97112- Neuromuscular re-education, 97535- Self Care, 96045- Manual therapy, [phone]- Gait training, [phone]- Aquatic Therapy, Patient/Family education, Balance training, Stair training, Dry Needling, Joint mobilization, Spinal mobilization, Scar mobilization, and DME instructions  PLAN FOR NEXT SESSION: HEP review and update, manual techniques as appropriate, aerobic tasks, ROM and flexibility activities, strengthening and PREs, TPDN, gait and balance training as needed     Bunny Caroli, PT 04/13/2024, 5:38 PM

## 2024-04-13 NOTE — Addendum Note (Signed)
 Addended by: Collins Dean I on: 04/13/2024 03:43 PM   Modules accepted: Orders

## 2024-04-13 NOTE — Addendum Note (Signed)
 Addended by: Russ Course F on: 04/13/2024 04:04 PM   Modules accepted: Level of Service

## 2024-04-13 NOTE — Addendum Note (Signed)
 Addended by: Collins Dean I on: 04/13/2024 03:55 PM   Modules accepted: Orders

## 2024-04-17 NOTE — Therapy (Deleted)
 OUTPATIENT PHYSICAL THERAPY TREATMENT NOTE   Patient Name: Dreana Ortlip MRN: [id number] DOB:[birth year], 53 y.o., female Today's Date: 04/17/2024  END OF SESSION:       No past medical history on file. Past Surgical History:  Procedure Laterality Date   COLONOSCOPY N/A 10/04/2019   Procedure: COLONOSCOPY;  Surgeon: Alyce Jubilee, MD;  [facility];  Service: Endoscopy;  Laterality: N/A;  11:00   POLYPECTOMY  10/04/2019   Procedure: POLYPECTOMY;  Surgeon: Alyce Jubilee, MD;  [facility]: [facility];  Service: Endoscopy;;  Hepatic Flexure Polyp   TUBAL LIGATION     Patient Active Problem List   Diagnosis Date Noted   Special screening for malignant neoplasms, colon     PCP: Susana Enter, FNP   REFERRING PROVIDER: Evertt Hoe, DPM  REFERRING DIAG: [phone] (ICD-10-CM) - Postoperative state M21.611 (ICD-10-CM) - Bunion of right foot  THERAPY DIAG:  No diagnosis found.  Rationale for Evaluation and Treatment: Rehabilitation  ONSET DATE: DOS: 12/22/2023 Procedure: Lapidus bunionectomy with Akin osteotomy right foot  SUBJECTIVE:   SUBJECTIVE STATEMENT:  Pt attended today's session with reports of 3/10 pain. Pt stated that they have maintained good compliance with current HEP.  Getting better every day, moving around makes it feel better.    PERTINENT HISTORY: 2.5 mo s/p left foot Lapidus and Aiken bunionectomy -Continues to progress slowly - Recommend CAM boot for 2 weeks and then proceed with transition to regular good walking shoes - Referral to PT -XR: Deferred will take next visit -WB Status: WBAT  -Medications/ABX: Ibuprofen  as needed - Return in 6 weeks for further follow-up PAIN:  Are you having pain? Yes: NPRS scale: 6/10 Pain location: R foot Pain description: ache Aggravating factors: wearing shoes Relieving factors: rest  PRECAUTIONS: None  RED FLAGS: None   WEIGHT BEARING RESTRICTIONS: WBAT  FALLS:  Has patient  fallen in last 6 months? No  OCCUPATION: not working  PLOF: Independent  PATIENT GOALS: To return to walking with a shoe  NEXT MD VISIT: 6 weeks  OBJECTIVE:  Note: Objective measures were completed at Evaluation unless otherwise noted.  DIAGNOSTIC FINDINGS: none available  PATIENT SURVEYS:  LEFS 8/80  MUSCLE LENGTH: deferred  POSTURE: deferred  PALPATION: TTP across surgical site  LOWER EXTREMITY ROM:  A/PROM Right eval Left eval  Hip flexion    Hip extension    Hip abduction    Hip adduction    Hip internal rotation    Hip external rotation    Knee flexion    Knee extension    Ankle dorsiflexion 0/8d   Ankle plantarflexion WFL   Ankle inversion WFL   Ankle eversion WFL   Great toe extension 30d 60d   (Blank rows = not tested)  LOWER EXTREMITY MMT:  MMT Right eval Left eval  Hip flexion    Hip extension    Hip abduction    Hip adduction    Hip internal rotation    Hip external rotation    Knee flexion    Knee extension    Ankle dorsiflexion 3   Ankle plantarflexion 2+   Ankle inversion 3   Ankle eversion 3    (Blank rows = not tested)  LOWER EXTREMITY SPECIAL TESTS:  deferred  FUNCTIONAL TESTS:  30 seconds chair stand test  GAIT: Distance walked: 3ftx2 Assistive device utilized: CAM boot Level of assistance: Complete Independence Comments: sloe cadence  TREATMENT: [facility] Adult PT Treatment:                                                DATE: 04/13/2024  Therapeutic Activity: NuStep 8', activity tolerance Standing toe yoga 3x1' Standing toe crunches  2x1' Standing INV/EV  60s on towel Calf stretch 2x2' B Standing heel raise 3x12, hold 3s UE assist PRN    [facility] Adult PT Treatment:                                                DATE: 04/06/24 Therapeutic Exercise: PF Stretch with towel 60s  x2 Therapeutic Activity: Toe yoga 60s INV/EV 60s on towel Toe crunches 60s 2 MWT 186 ft   [facility] Adult PT Treatment:                                                DATE: 04/04/2024 Therapeutic Exercise: NuStep 5' Seated DF soleus stretch with manual overpressure 2x1' Therapeutic Activity:  SLS on compliant surface w/ UE assist PRN 2x2' Heel raises with UE assist PRN 2x15, 3 hold Standing great toe ext stretch 2x1' Standing Great toe isolation 2x8   [facility] Adult PT Treatment:                                                DATE: 03/16/24 Eval (due to time constraints)   PATIENT EDUCATION:  Education details: Discussed eval findings, rehab rationale and POC and patient is in agreement  Person educated: Patient Education method: Explanation Education comprehension: verbalized understanding and needs further education  HOME EXERCISE PROGRAM: Access Code: [email or web address] URL: [email or web address] Date: 04/06/2024 Prepared by: Gretta Leavens  Exercises - Long Sitting Plantar Fascia Stretch with Towel  - 2-3 x daily - 5 x weekly - 1 sets - 2 reps - 60s hold - Toe Yoga - Alternating Great Toe and Lesser Toe Extension  - 2-3 x daily - 5 x weekly - 1 sets - 1 reps - 60s hold - Towel Scrunches  - 2-3 x daily - 5 x weekly - 1 sets - 1 reps - 60s hold - Ankle Inversion Eversion Towel Slide  - 2-3 x daily - 5 x weekly - 1 sets - 1 reps - 60s hold  ASSESSMENT:  CLINICAL IMPRESSION:  Pt attended physical therapy session for continuation of treatment regarding R bunion surgery. Today's treatment focused on improvement of  WB tolerance, ankle/toe mobility, and intrinsic strength. Pt showed great tolerance to administered treatment with no adverse effects by the end of session. Pt had a reduction in pain as activity progressed. Pt required minimal verbal/tactile cuing alongside no physical assistance for safe and appropriate performance of today's activities. Continue to progress wb  activities as tolerated.   Patient is a 53 y.o. female who was seen today for physical therapy evaluation and treatment to address ROM, strength and functional deficits following R bunion surgery 12/22/23.  She is WBAT in a CAM  boot and will begin to transition into a shoe ASAP.  Ankle ROM restricted in DF only, mobility restrictions identified in R great toe.  Patient able to SLS on R but <10s.  OBJECTIVE IMPAIRMENTS: Abnormal gait, decreased activity tolerance, decreased balance, decreased knowledge of condition, decreased knowledge of use of DME, decreased mobility, difficulty walking, decreased ROM, decreased strength, increased fascial restrictions, impaired perceived functional ability, and pain.   ACTIVITY LIMITATIONS: carrying, lifting, sitting, standing, squatting, stairs, and transfers  PERSONAL FACTORS: Behavior pattern, Fitness, and Time since onset of injury/illness/exacerbation are also affecting patient's functional outcome.   REHAB POTENTIAL: Good  CLINICAL DECISION MAKING: Evolving/moderate complexity  EVALUATION COMPLEXITY: Low   GOALS: Goals reviewed with patient? No  SHORT TERM GOALS: Target date: 04/06/2024   Patient to demonstrate independence in HEP  Baseline: TBD; [id number] Goal status: INITIAL  2.  Assess 2 MWT and establish baseline Baseline: TBD; 186 ft Goal status: Met   LONG TERM GOALS: Target date: 04/27/2024  Patient will increase 30s chair stand reps from 1 to 8 with/without arms to demonstrate and improved functional ability with less pain/difficulty as well as reduce fall risk.  Baseline:  Goal status: INITIAL  2.  Patient will acknowledge 2/10 pain at least once during episode of care   Baseline: 5/10 Goal status: INITIAL  3.  Patient will score at least 40/80 on FOTO to signify clinically meaningful improvement in functional abilities.   Baseline: 8/80 Goal status: INITIAL  4.  Increase R ankle strength to 4/5 Baseline:   R   Ankle  dorsiflexion 3   Ankle plantarflexion 2+   Ankle inversion 3   Ankle eversion 3    Goal status: INITIAL  5.  10s R SLS Baseline: 3s Goal status: INITIAL  6.  Increase R great toe extension to 45d and R DF to 10d Baseline:   R   Ankle dorsiflexion 0/8d   Ankle plantarflexion WFL   Ankle inversion WFL   Ankle eversion WFL   Great toe extension 10/30d 60d   Goal status: INITIAL   PLAN:  PT FREQUENCY: 2x/week  PT DURATION: 6 weeks  PLANNED INTERVENTIONS: 57846- PT Re-evaluation, 97110-Therapeutic exercises, 97530- Therapeutic activity, 97112- Neuromuscular re-education, 97535- Self Care, 96295- Manual therapy, [phone]- Gait training, [phone]- Aquatic Therapy, Patient/Family education, Balance training, Stair training, Dry Needling, Joint mobilization, Spinal mobilization, Scar mobilization, and DME instructions  PLAN FOR NEXT SESSION: HEP review and update, manual techniques as appropriate, aerobic tasks, ROM and flexibility activities, strengthening and PREs, TPDN, gait and balance training as needed     Eldon Greenland, PT 04/17/2024, 2:27 PM

## 2024-04-18 ENCOUNTER — Ambulatory Visit

## 2024-04-19 NOTE — Therapy (Deleted)
 OUTPATIENT PHYSICAL THERAPY TREATMENT NOTE   Patient Name: Anna Sosa MRN: [id number] DOB:28-Dec-1970, 53 y.o., female Today's Date: 04/19/2024  END OF SESSION:       No past medical history on file. Past Surgical History:  Procedure Laterality Date   COLONOSCOPY N/A 10/04/2019   Procedure: COLONOSCOPY;  Surgeon: Alyce Jubilee, MD;  Location: [facility];  Service: Endoscopy;  Laterality: N/A;  11:00   POLYPECTOMY  10/04/2019   Procedure: POLYPECTOMY;  Surgeon: Alyce Jubilee, MD;  Location: [facility];  Service: Endoscopy;;  Hepatic Flexure Polyp   TUBAL LIGATION     Patient Active Problem List   Diagnosis Date Noted   Special screening for malignant neoplasms, colon     PCP: Susana Enter, FNP   REFERRING PROVIDER: Evertt Hoe, DPM  REFERRING DIAG: [phone] (ICD-10-CM) - Postoperative state M21.611 (ICD-10-CM) - Bunion of right foot  THERAPY DIAG:  No diagnosis found.  Rationale for Evaluation and Treatment: Rehabilitation  ONSET DATE: DOS: 12/22/2023 Procedure: Lapidus bunionectomy with Akin osteotomy right foot  SUBJECTIVE:   SUBJECTIVE STATEMENT:  Pt attended today's session with reports of 3/10 pain. Pt stated that they have maintained good compliance with current HEP.  Getting better every day, moving around makes it feel better.    PERTINENT HISTORY: 2.5 mo s/p left foot Lapidus and Aiken bunionectomy -Continues to progress slowly - Recommend CAM boot for 2 weeks and then proceed with transition to regular good walking shoes - Referral to PT -XR: Deferred will take next visit -WB Status: WBAT  -Medications/ABX: Ibuprofen  as needed - Return in 6 weeks for further follow-up PAIN:  Are you having pain? Yes: NPRS scale: 6/10 Pain location: R foot Pain description: ache Aggravating factors: wearing shoes Relieving factors: rest  PRECAUTIONS: None  RED FLAGS: None   WEIGHT BEARING RESTRICTIONS: WBAT  FALLS:  Has patient  fallen in last 6 months? No  OCCUPATION: not working  PLOF: Independent  PATIENT GOALS: To return to walking with a shoe  NEXT MD VISIT: 6 weeks  OBJECTIVE:  Note: Objective measures were completed at Evaluation unless otherwise noted.  DIAGNOSTIC FINDINGS: none available  PATIENT SURVEYS:  LEFS 8/80  MUSCLE LENGTH: deferred  POSTURE: deferred  PALPATION: TTP across surgical site  LOWER EXTREMITY ROM:  A/PROM Right eval Left eval  Hip flexion    Hip extension    Hip abduction    Hip adduction    Hip internal rotation    Hip external rotation    Knee flexion    Knee extension    Ankle dorsiflexion 0/8d   Ankle plantarflexion WFL   Ankle inversion WFL   Ankle eversion WFL   Great toe extension 30d 60d   (Blank rows = not tested)  LOWER EXTREMITY MMT:  MMT Right eval Left eval  Hip flexion    Hip extension    Hip abduction    Hip adduction    Hip internal rotation    Hip external rotation    Knee flexion    Knee extension    Ankle dorsiflexion 3   Ankle plantarflexion 2+   Ankle inversion 3   Ankle eversion 3    (Blank rows = not tested)  LOWER EXTREMITY SPECIAL TESTS:  deferred  FUNCTIONAL TESTS:  30 seconds chair stand test  GAIT: Distance walked: 46ftx2 Assistive device utilized: CAM boot Level of assistance: Complete Independence Comments: sloe cadence  TREATMENT: [facility] Adult PT Treatment:                                                DATE: 04/13/2024  Therapeutic Activity: NuStep 8', activity tolerance Standing toe yoga 3x1' Standing toe crunches  2x1' Standing INV/EV  60s on towel Calf stretch 2x2' B Standing heel raise 3x12, hold 3s UE assist PRN    [facility] Adult PT Treatment:                                                DATE: 04/06/24 Therapeutic Exercise: PF Stretch with towel 60s  x2 Therapeutic Activity: Toe yoga 60s INV/EV 60s on towel Toe crunches 60s 2 MWT 186 ft   [facility] Adult PT Treatment:                                                DATE: 04/04/2024 Therapeutic Exercise: NuStep 5' Seated DF soleus stretch with manual overpressure 2x1' Therapeutic Activity:  SLS on compliant surface w/ UE assist PRN 2x2' Heel raises with UE assist PRN 2x15, 3 hold Standing great toe ext stretch 2x1' Standing Great toe isolation 2x8   [facility] Adult PT Treatment:                                                DATE: 03/16/24 Eval (due to time constraints)   PATIENT EDUCATION:  Education details: Discussed eval findings, rehab rationale and POC and patient is in agreement  Person educated: Patient Education method: Explanation Education comprehension: verbalized understanding and needs further education  HOME EXERCISE PROGRAM: Access Code: [email or web address] URL: [email or web address] Date: 04/06/2024 Prepared by: Gretta Leavens  Exercises - Long Sitting Plantar Fascia Stretch with Towel  - 2-3 x daily - 5 x weekly - 1 sets - 2 reps - 60s hold - Toe Yoga - Alternating Great Toe and Lesser Toe Extension  - 2-3 x daily - 5 x weekly - 1 sets - 1 reps - 60s hold - Towel Scrunches  - 2-3 x daily - 5 x weekly - 1 sets - 1 reps - 60s hold - Ankle Inversion Eversion Towel Slide  - 2-3 x daily - 5 x weekly - 1 sets - 1 reps - 60s hold  ASSESSMENT:  CLINICAL IMPRESSION:  Pt attended physical therapy session for continuation of treatment regarding R bunion surgery. Today's treatment focused on improvement of  WB tolerance, ankle/toe mobility, and intrinsic strength. Pt showed great tolerance to administered treatment with no adverse effects by the end of session. Pt had a reduction in pain as activity progressed. Pt required minimal verbal/tactile cuing alongside no physical assistance for safe and appropriate performance of today's activities. Continue to progress wb  activities as tolerated.   Patient is a 53 y.o. female who was seen today for physical therapy evaluation and treatment to address ROM, strength and functional deficits following R bunion surgery 12/22/23.  She is WBAT in a CAM  boot and will begin to transition into a shoe ASAP.  Ankle ROM restricted in DF only, mobility restrictions identified in R great toe.  Patient able to SLS on R but <10s.  OBJECTIVE IMPAIRMENTS: Abnormal gait, decreased activity tolerance, decreased balance, decreased knowledge of condition, decreased knowledge of use of DME, decreased mobility, difficulty walking, decreased ROM, decreased strength, increased fascial restrictions, impaired perceived functional ability, and pain.   ACTIVITY LIMITATIONS: carrying, lifting, sitting, standing, squatting, stairs, and transfers  PERSONAL FACTORS: Behavior pattern, Fitness, and Time since onset of injury/illness/exacerbation are also affecting patient's functional outcome.   REHAB POTENTIAL: Good  CLINICAL DECISION MAKING: Evolving/moderate complexity  EVALUATION COMPLEXITY: Low   GOALS: Goals reviewed with patient? No  SHORT TERM GOALS: Target date: 04/06/2024   Patient to demonstrate independence in HEP  Baseline: TBD; [id number] Goal status: INITIAL  2.  Assess 2 MWT and establish baseline Baseline: TBD; 186 ft Goal status: Met   LONG TERM GOALS: Target date: 04/27/2024  Patient will increase 30s chair stand reps from 1 to 8 with/without arms to demonstrate and improved functional ability with less pain/difficulty as well as reduce fall risk.  Baseline:  Goal status: INITIAL  2.  Patient will acknowledge 2/10 pain at least once during episode of care   Baseline: 5/10 Goal status: INITIAL  3.  Patient will score at least 40/80 on FOTO to signify clinically meaningful improvement in functional abilities.   Baseline: 8/80 Goal status: INITIAL  4.  Increase R ankle strength to 4/5 Baseline:   R   Ankle  dorsiflexion 3   Ankle plantarflexion 2+   Ankle inversion 3   Ankle eversion 3    Goal status: INITIAL  5.  10s R SLS Baseline: 3s Goal status: INITIAL  6.  Increase R great toe extension to 45d and R DF to 10d Baseline:   R   Ankle dorsiflexion 0/8d   Ankle plantarflexion WFL   Ankle inversion WFL   Ankle eversion WFL   Great toe extension 10/30d 60d   Goal status: INITIAL   PLAN:  PT FREQUENCY: 2x/week  PT DURATION: 6 weeks  PLANNED INTERVENTIONS: 29562- PT Re-evaluation, 97110-Therapeutic exercises, 97530- Therapeutic activity, 97112- Neuromuscular re-education, 97535- Self Care, 13086- Manual therapy, [phone]- Gait training, [phone]- Aquatic Therapy, Patient/Family education, Balance training, Stair training, Dry Needling, Joint mobilization, Spinal mobilization, Scar mobilization, and DME instructions  PLAN FOR NEXT SESSION: HEP review and update, manual techniques as appropriate, aerobic tasks, ROM and flexibility activities, strengthening and PREs, TPDN, gait and balance training as needed     Eldon Green[place], PT 04/19/2024, 1:28 PM

## 2024-04-20 ENCOUNTER — Ambulatory Visit

## 2024-09-07 ENCOUNTER — Encounter (INDEPENDENT_AMBULATORY_CARE_PROVIDER_SITE_OTHER): Payer: Self-pay | Admitting: *Deleted

## 2024-10-17 ENCOUNTER — Ambulatory Visit: Admitting: Podiatry
